# Patient Record
Sex: Female | Born: 1980
Health system: Southern US, Community
[De-identification: ages and names within clinical notes are randomized; demographics above are authoritative.]

## PROBLEM LIST (undated history)

## (undated) DIAGNOSIS — E282 Polycystic ovarian syndrome: Secondary | ICD-10-CM

## (undated) DIAGNOSIS — T7840XA Allergy, unspecified, initial encounter: Secondary | ICD-10-CM

## (undated) DIAGNOSIS — F32A Depression, unspecified: Secondary | ICD-10-CM

## (undated) DIAGNOSIS — F329 Major depressive disorder, single episode, unspecified: Secondary | ICD-10-CM

## (undated) HISTORY — DX: Polycystic ovarian syndrome: E28.2

## (undated) HISTORY — DX: Depression, unspecified: F32.A

## (undated) HISTORY — DX: Allergy, unspecified, initial encounter: T78.40XA

## (undated) HISTORY — PX: ABDOMINAL HYSTERECTOMY: SHX81

## (undated) HISTORY — PX: GASTRIC BYPASS: SHX52

## (undated) HISTORY — PX: TONSILLECTOMY AND ADENOIDECTOMY: SHX28

---
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
## 2000-03-02 HISTORY — PX: REDUCTION MAMMAPLASTY: SUR839

## 2013-02-13 DIAGNOSIS — L509 Urticaria, unspecified: Secondary | ICD-10-CM | POA: Insufficient documentation

## 2018-04-06 DIAGNOSIS — Z713 Dietary counseling and surveillance: Secondary | ICD-10-CM | POA: Diagnosis not present

## 2018-04-12 DIAGNOSIS — Z713 Dietary counseling and surveillance: Secondary | ICD-10-CM | POA: Diagnosis not present

## 2018-05-04 DIAGNOSIS — Z713 Dietary counseling and surveillance: Secondary | ICD-10-CM | POA: Diagnosis not present

## 2018-05-10 DIAGNOSIS — Z713 Dietary counseling and surveillance: Secondary | ICD-10-CM | POA: Diagnosis not present

## 2018-08-22 ENCOUNTER — Emergency Department
Admission: EM | Admit: 2018-08-22 | Discharge: 2018-08-22 | Disposition: A | Discharge status: Home / Self Care | Payer: BC Managed Care – PPO | Attending: Emergency Medicine | Admitting: Emergency Medicine

## 2018-08-22 ENCOUNTER — Emergency Department: Payer: BC Managed Care – PPO

## 2018-08-22 ENCOUNTER — Other Ambulatory Visit: Payer: Self-pay

## 2018-08-22 DIAGNOSIS — Z03818 Encounter for observation for suspected exposure to other biological agents ruled out: Secondary | ICD-10-CM | POA: Diagnosis not present

## 2018-08-22 DIAGNOSIS — L500 Allergic urticaria: Secondary | ICD-10-CM | POA: Diagnosis not present

## 2018-08-22 DIAGNOSIS — Z20828 Contact with and (suspected) exposure to other viral communicable diseases: Secondary | ICD-10-CM | POA: Insufficient documentation

## 2018-08-22 DIAGNOSIS — T7840XA Allergy, unspecified, initial encounter: Secondary | ICD-10-CM | POA: Diagnosis not present

## 2018-08-22 DIAGNOSIS — L509 Urticaria, unspecified: Secondary | ICD-10-CM | POA: Diagnosis not present

## 2018-08-22 LAB — CBC WITH DIFFERENTIAL/PLATELET
Abs Immature Granulocytes: 0.05 10*3/uL (ref 0.00–0.07)
Basophils Absolute: 0 10*3/uL (ref 0.0–0.1)
Basophils Relative: 0 %
Eosinophils Absolute: 0 10*3/uL (ref 0.0–0.5)
Eosinophils Relative: 0 %
HCT: 28.3 % — ABNORMAL LOW (ref 36.0–46.0)
Hemoglobin: 8.7 g/dL — ABNORMAL LOW (ref 12.0–15.0)
Immature Granulocytes: 1 %
Lymphocytes Relative: 8 %
Lymphs Abs: 0.9 10*3/uL (ref 0.7–4.0)
MCH: 21.6 pg — ABNORMAL LOW (ref 26.0–34.0)
MCHC: 30.7 g/dL (ref 30.0–36.0)
MCV: 70.2 fL — ABNORMAL LOW (ref 80.0–100.0)
Monocytes Absolute: 0.2 10*3/uL (ref 0.1–1.0)
Monocytes Relative: 2 %
Neutro Abs: 9.6 10*3/uL — ABNORMAL HIGH (ref 1.7–7.7)
Neutrophils Relative %: 89 %
Platelets: 308 10*3/uL (ref 150–400)
RBC: 4.03 MIL/uL (ref 3.87–5.11)
RDW: 16.2 % — ABNORMAL HIGH (ref 11.5–15.5)
WBC: 10.7 10*3/uL — ABNORMAL HIGH (ref 4.0–10.5)
nRBC: 0 % (ref 0.0–0.2)

## 2018-08-22 LAB — URINALYSIS, COMPLETE (UACMP) WITH MICROSCOPIC
Bacteria, UA: NONE SEEN
Bilirubin Urine: NEGATIVE
Glucose, UA: NEGATIVE mg/dL
Hgb urine dipstick: NEGATIVE
Ketones, ur: NEGATIVE mg/dL
Leukocytes,Ua: NEGATIVE
Nitrite: NEGATIVE
Protein, ur: NEGATIVE mg/dL
Specific Gravity, Urine: 1.013 (ref 1.005–1.030)
pH: 6 (ref 5.0–8.0)

## 2018-08-22 LAB — COMPREHENSIVE METABOLIC PANEL
ALT: 26 U/L (ref 0–44)
AST: 23 U/L (ref 15–41)
Albumin: 3.7 g/dL (ref 3.5–5.0)
Alkaline Phosphatase: 37 U/L — ABNORMAL LOW (ref 38–126)
Anion gap: 8 (ref 5–15)
BUN: 15 mg/dL (ref 6–20)
CO2: 22 mmol/L (ref 22–32)
Calcium: 8.2 mg/dL — ABNORMAL LOW (ref 8.9–10.3)
Chloride: 110 mmol/L (ref 98–111)
Creatinine, Ser: 0.61 mg/dL (ref 0.44–1.00)
GFR calc Af Amer: >60 mL/min (ref >60)
GFR calc non Af Amer: >60 mL/min (ref >60)
Glucose, Bld: 127 mg/dL — ABNORMAL HIGH (ref 70–99)
Potassium: 3.6 mmol/L (ref 3.5–5.1)
Sodium: 140 mmol/L (ref 135–145)
Total Bilirubin: 0.4 mg/dL (ref 0.3–1.2)
Total Protein: 6.5 g/dL (ref 6.5–8.1)

## 2018-08-22 MED ORDER — DIPHENHYDRAMINE HCL 50 MG/ML IJ SOLN
25.0000 mg | Freq: Once | INTRAMUSCULAR | Status: AC
  Administered 2018-08-22: 25 mg via INTRAVENOUS
  Filled 2018-08-22: qty 1

## 2018-08-22 MED ORDER — METHYLPREDNISOLONE SODIUM SUCC 125 MG IJ SOLR
125.0000 mg | Freq: Once | INTRAMUSCULAR | Status: AC
  Administered 2018-08-22: 125 mg via INTRAVENOUS
  Filled 2018-08-22: qty 2

## 2018-08-22 MED ORDER — FAMOTIDINE IN NACL 20-0.9 MG/50ML-% IV SOLN
20.0000 mg | Freq: Once | INTRAVENOUS | Status: AC
  Administered 2018-08-22: 20 mg via INTRAVENOUS
  Filled 2018-08-22: qty 50

## 2018-08-22 MED ORDER — FAMOTIDINE 20 MG PO TABS: 20.0000 mg | ORAL_TABLET | Freq: Two times a day (BID) | ORAL | 0 refills | Status: DC

## 2018-08-22 MED ORDER — PREDNISONE 20 MG PO TABS: ORAL_TABLET | ORAL | 0 refills | Status: DC

## 2018-08-22 MED ORDER — EPINEPHRINE 0.3 MG/0.3ML IJ SOAJ
0.3000 mg | Freq: Once | INTRAMUSCULAR | Status: AC
  Administered 2018-08-22: 0.3 mg via INTRAMUSCULAR
  Filled 2018-08-22: qty 0.3

## 2018-08-22 MED ORDER — SODIUM CHLORIDE 0.9 % IV BOLUS
1000.0000 mL | Freq: Once | INTRAVENOUS | Status: AC
  Administered 2018-08-22: 1000 mL via INTRAVENOUS

## 2018-08-22 MED ORDER — EPINEPHRINE 0.3 MG/0.3ML IJ SOAJ: 0.3000 mg | Freq: Once | INTRAMUSCULAR | 2 refills | Status: AC

## 2018-08-22 NOTE — Discharge Instructions (Signed)
1. Take the following medicines for the next 4 days: Prednisone 60mg daily Pepcid 20mg twice daily 2. Take Benadryl as needed for itching. 3. Use Epi-Pen in case of acute, life-threatening allergic reaction. 4. Return to the ER for worsening symptoms, persistent vomiting, difficulty breathing or other concerns.  

## 2018-08-22 NOTE — ED Notes (Signed)
Pt speaking with this RN in NAD, reports no difficulty breathing. Lip swelling has subsided.

## 2018-08-22 NOTE — ED Triage Notes (Signed)
Pt with hives noted to face, around eyes, pt denies shob, pt is hoarse, complains of throat soreness. No resp distress noted.

## 2018-08-22 NOTE — ED Provider Notes (Signed)
[facility] Emergency Department Provider Note   ____________________________________________   First MD Initiated Contact with Patient 08/22/18 [phone]     (approximate)  I have reviewed the triage vital signs and the nursing notes.   HISTORY  Chief Complaint Allergic Reaction    HPI Sandra Munoz is a 38 y.o. female who presents to the ED from home with a chief complaint of acute allergic reaction with hives.  Patient has had similar symptoms previously.  Was followed by immunology in [place] when she lived there with testing which did not determine etiology of her allergic reactions.  States she has been having progressive hives all week.  She had a telemedicine visit earlier in the week and was prescribed prednisone 50 mg daily.  Her last dose was 2 days ago.  Symptoms worsened overnight and now patient is having a sensation of throat soreness.  Took 50 mg oral Benadryl prior to arrival.  Did not have an EpiPen at home.  Denies new food, medications, environmental exposures.  Denies recent fever, cough, chest pain, shortness of breath, abdominal pain, nausea, vomiting or dizziness.  Denies recent travel, trauma or exposure to persons diagnosed with coronavirus.       Past medical history Allergic reactions   There are no active problems to display for this patient.    Prior to Admission medications   Medication Sig Start Date End Date Taking? Authorizing Provider  EPINEPHrine 0.3 mg/0.3 mL IJ SOAJ injection Inject 0.3 mLs (0.3 mg total) into the muscle once for 1 dose. 08/22/18 08/22/18  Irean HongSung, Kennen Stammer J, MD  famotidine (PEPCID) 20 MG tablet Take 1 tablet (20 mg total) by mouth 2 (two) times daily. 08/22/18   Irean HongSung, Aletheia Tangredi J, MD  predniSONE (DELTASONE) 20 MG tablet 3 tablets daily x 4 days 08/22/18   Irean HongSung, Mohamed Portlock J, MD    Allergies Patient has no known allergies.  No family history on file.  Social History Social History   Tobacco Use  . Smoking  status: Not on file  Substance Use Topics  . Alcohol use: Not on file  . Drug use: Not on file  No recent EtOH  Review of Systems  Constitutional: No fever/chills Eyes: No visual changes. ENT: Sensation of throat soreness.  No sore throat. Cardiovascular: Denies chest pain. Respiratory: Denies shortness of breath. Gastrointestinal: No abdominal pain.  No nausea, no vomiting.  No diarrhea.  No constipation. Genitourinary: Negative for dysuria. Musculoskeletal: Negative for back pain. Skin: Positive for rash. Neurological: Negative for headaches, focal weakness or numbness.   ____________________________________________   PHYSICAL EXAM:  VITAL SIGNS: ED Triage Vitals  Enc Vitals Group     BP 08/22/18 0324 116/64     Pulse Rate 08/22/18 0324 100     Resp 08/22/18 0324 16     Temp 08/22/18 0324 98.8 F (37.1 C)     Temp Source 08/22/18 0324 Oral     SpO2 08/22/18 0324 98 %     Weight 08/22/18 0325 144 lb (65.3 kg)     Height 08/22/18 0325 4\' 11"  (1.499 m)     Head Circumference --      Peak Flow --      Pain Score 08/22/18 0325 0     Pain Loc --      Pain Edu? --      Excl. in GC? --     Constitutional: Alert and oriented.  Uncomfortable appearing and in mild acute distress. Eyes: Conjunctivae are  normal. PERRL. EOMI. Head: Atraumatic. Nose: No congestion/rhinnorhea. Mouth/Throat: Mucous membranes are moist.  Oropharynx non-erythematous.  No tongue or lip swelling.  Minimally hoarse voice.  No drooling. Neck: No stridor.  Soft submental space. Cardiovascular: Normal rate, regular rhythm. Grossly normal heart sounds.  Good peripheral circulation. Respiratory: Normal respiratory effort.  No retractions. Lungs CTAB. Gastrointestinal: Soft and nontender. No distention. No abdominal bruits. No CVA tenderness. Musculoskeletal: No lower extremity tenderness nor edema.  No joint effusions. Neurologic:  Normal speech and language. No gross focal neurologic deficits are  appreciated. No gait instability. Skin:  Skin is warm, dry and intact.  Diffuse urticaria noted. Psychiatric: Mood and affect are normal. Speech and behavior are normal.  ____________________________________________   LABS (all labs ordered are listed, but only abnormal results are displayed)  Labs Reviewed  NOVEL CORONAVIRUS, NAA (HOSPITAL ORDER, SEND-OUT TO REF LAB)  CBC WITH DIFFERENTIAL/PLATELET  COMPREHENSIVE METABOLIC PANEL  URINALYSIS, COMPLETE (UACMP) WITH MICROSCOPIC   ____________________________________________  EKG  None ____________________________________________  RADIOLOGY  ED MD interpretation: None  Official radiology report(s): No results found.  ____________________________________________   PROCEDURES  Procedure(s) performed (including Critical Care):  Procedures   ____________________________________________   INITIAL IMPRESSION / ASSESSMENT AND PLAN / ED COURSE  As part of my medical decision making, I reviewed the following data within the electronic MEDICAL RECORD NUMBER Nursing notes reviewed and incorporated, Old chart reviewed and Notes from prior ED visits     Sandra DieterMary Haugen was evaluated in Emergency Department on 08/22/2018 for the symptoms described in the history of present illness. She was evaluated in the context of the global COVID-19 pandemic, which necessitated consideration that the patient might be at risk for infection with the SARS-CoV-2 virus that causes COVID-19. Institutional protocols and algorithms that pertain to the evaluation of patients at risk for COVID-19 are in a state of rapid change based on information released by regulatory bodies including the CDC and federal and state organizations. These policies and algorithms were followed during the patient's care in the ED.   38 year old female with prior history of allergic reactions presenting with diffuse urticaria and sensation of throat soreness; no angioedema.  Administer  IV allergic reaction cocktail to include Solu-Medrol, Pepcid, fluids.  Administer EpiPen.  Patient took Benadryl prior to arrival.  Will observe in the ED for a minimum of 3 to 4 hours.  Clinical Course as of Aug 22 654  Mon Aug 22, 2018  0623 Patient's hives are almost resolved.  Her throat sensation is feeling significantly better.  However, patient is concerned because she has hives on the palms of her hands which she states she has never had previously.  She is concerned her allergic reaction is stemming from an "internal infection".  We discussed her concern and will check basic labs including send out COVID swab.   [JS]  [id number] Laboratory and imaging pending.  Care is signed out to Dr. Cyril LoosenKinner at change of shift.  Anticipate discharge home on EpiPen, Prednisone and Pepcid.   [JS]    Clinical Course User Index [JS] Irean HongSung, Lakeem Rozo J, MD     ____________________________________________   FINAL CLINICAL IMPRESSION(S) / ED DIAGNOSES  Final diagnoses:  Allergic reaction, initial encounter  Urticaria     ED Discharge Orders         Ordered    EPINEPHrine 0.3 mg/0.3 mL IJ SOAJ injection   Once     08/22/18 0342    predniSONE (DELTASONE) 20 MG tablet  08/22/18 0342    famotidine (PEPCID) 20 MG tablet  2 times daily     08/22/18 8206           Note:  This document was prepared using Dragon voice recognition software and may include unintentional dictation errors.   Paulette Blanch, MD 08/22/18 [phone]

## 2018-08-23 LAB — NOVEL CORONAVIRUS, NAA (HOSP ORDER, SEND-OUT TO REF LAB; TAT 18-24 HRS): SARS-CoV-2, NAA: NOT DETECTED

## 2018-08-26 DIAGNOSIS — L500 Allergic urticaria: Secondary | ICD-10-CM | POA: Diagnosis not present

## 2018-08-26 DIAGNOSIS — J301 Allergic rhinitis due to pollen: Secondary | ICD-10-CM | POA: Diagnosis not present

## 2018-08-30 ENCOUNTER — Ambulatory Visit (INDEPENDENT_AMBULATORY_CARE_PROVIDER_SITE_OTHER): Payer: BC Managed Care – PPO | Admitting: Family Medicine

## 2018-08-30 ENCOUNTER — Other Ambulatory Visit: Payer: Self-pay

## 2018-08-30 ENCOUNTER — Encounter: Payer: Self-pay | Admitting: Family Medicine

## 2018-08-30 VITALS — BP 112/70 | HR 90 | Temp 98.0°F | Resp 16 | Ht 59.0 in | Wt 150.0 lb

## 2018-08-30 DIAGNOSIS — F419 Anxiety disorder, unspecified: Secondary | ICD-10-CM | POA: Insufficient documentation

## 2018-08-30 DIAGNOSIS — Z8659 Personal history of other mental and behavioral disorders: Secondary | ICD-10-CM | POA: Insufficient documentation

## 2018-08-30 DIAGNOSIS — Z8619 Personal history of other infectious and parasitic diseases: Secondary | ICD-10-CM

## 2018-08-30 DIAGNOSIS — L509 Urticaria, unspecified: Secondary | ICD-10-CM

## 2018-08-30 DIAGNOSIS — F329 Major depressive disorder, single episode, unspecified: Secondary | ICD-10-CM

## 2018-08-30 DIAGNOSIS — F32A Depression, unspecified: Secondary | ICD-10-CM

## 2018-08-30 MED ORDER — LEVOCETIRIZINE DIHYDROCHLORIDE 5 MG PO TABS: 5.0000 mg | ORAL_TABLET | Freq: Every morning | ORAL | 1 refills | Status: DC

## 2018-08-30 MED ORDER — FAMOTIDINE 20 MG PO TABS: 20.0000 mg | ORAL_TABLET | Freq: Two times a day (BID) | ORAL | 0 refills | Status: DC

## 2018-08-30 NOTE — Progress Notes (Signed)
Name: Sandra Munoz   MRN: [id number]    DOB: Feb 19, 1981   Date:08/30/2018       Progress Note  Subjective  Chief Complaint  Chief Complaint  Patient presents with  . Establish Care  . Urticaria    seen in ER , all over face swollen. Hives comes and goes for 1 month    HPI  PT presents to establish care and for urticaria.  She was seen in the ER 08/22/2018, was given prednisone, pepcid, and benadryl; also given epipen PRN.  She completed this course and did see improvement of the hives yesterday, but they returned again today.  She saw an allergist on Friday 08/26/2018, had blood work done, and was told to wait things out with no additional treatment at this time. She does have hydroxyzine, and has been taking 50mg  once daily at night which has been helping only a little bit. Claritin OTC has not been working during the day.  She denies chest tightness, shortness of breath, oral swelling.  She has been logging her foods and trying to ID triggers.  [place] [facility]. She is married and has a 226 year old daughter and a stepson who is almost 7018.  S/p Hysterectomy 2019 - took cervix, left ovaries intact.  She has hx PCOS.   Anxiety and Depression and OCD: Diagnosed with OCD about 7-8 years ago.  Mom had Bipolar 1.  She manages her symptoms with CBT, taking 150mg  Zoloft daily which helps her significantly.  She has not found counseling here yet, but has been working on some workbooks from her prior counselor. No SI/HI.   Patient Active Problem List   Diagnosis Date Noted  . History of OCD (obsessive compulsive disorder) 08/30/2018  . Anxiety and depression 08/30/2018  . Urticaria of unknown origin 02/13/2013    Past Surgical History:  Procedure Laterality Date  . ABDOMINAL HYSTERECTOMY    . GASTRIC BYPASS      Family History  Problem Relation Age of Onset  . Cancer Mother   . Autoimmune disease Mother     Social History   Socioeconomic History  .  Marital status: Married    Spouse name: Cristal DeerChristopher  . Number of children: 1  . Years of education: Not on file  . Highest education level: Not on file  Occupational History  . Not on file  Social Needs  . Financial resource strain: Not hard at all  . Food insecurity    Worry: Never true    Inability: Never true  . Transportation needs    Medical: No    Non-medical: No  Tobacco Use  . Smoking status: Former Smoker    Types: Cigarettes    Quit date: 09/30/2009    Years since quitting: 8.9  . Smokeless tobacco: Never Used  Substance and Sexual Activity  . Alcohol use: Never    Frequency: Never  . Drug use: Never  . Sexual activity: Yes    Birth control/protection: Surgical  Lifestyle  . Physical activity    Days per week: 3 days    Minutes per session: 60 min  . Stress: Only a little  Relationships  . Social connections    Talks on phone: More than three times a week    Gets together: More than three times a week    Attends religious service: 1 to 4 times per year    Active member of club or organization: Not on file  Attends meetings of clubs or organizations: Never    Relationship status: Married  . Intimate partner violence    Fear of current or ex partner: No    Emotionally abused: No    Physically abused: No    Forced sexual activity: No  Other Topics Concern  . Not on file  Social History Narrative  . Not on file     Current Outpatient Medications:  .  hydrOXYzine (VISTARIL) 50 MG capsule, TAKE 1 TO 2 CAPSULES BY MOUTH THREE TIMES DAILY AS NEEDED FOR ANXIETY INSOMNIA., Disp: , Rfl:  .  Probiotic Product (PROBIOTIC-10) CHEW, , Disp: , Rfl:  .  sertraline (ZOLOFT) 100 MG tablet, 150 mg. , Disp: , Rfl:  .  valACYclovir (VALTREX) 500 MG tablet, , Disp: , Rfl:   No Known Allergies  I personally reviewed active problem list, medication list, allergies, notes from last encounter, lab results with the patient/caregiver today.   ROS Constitutional: Negative  for fever or weight change.  Respiratory: Negative for cough and shortness of breath.   Cardiovascular: Negative for chest pain or palpitations.  Gastrointestinal: Negative for abdominal pain, no bowel changes.  Musculoskeletal: Negative for gait problem or joint swelling.  Skin: Positive for rash.  Neurological: Negative for dizziness or headache.  No other specific complaints in a complete review of systems (except as listed in HPI above).  Objective  Vitals:   08/30/18 0752  BP: 112/70  Pulse: 90  Resp: 16  Temp: 98 F (36.7 C)  TempSrc: Oral  SpO2: 99%  Weight: 150 lb (68 kg)  Height: 4\' 11"  (1.499 m)   Body mass index is 30.3 kg/m.  Physical Exam  Constitutional: Patient appears well-developed and well-nourished. No distress.  HENT: Head: Normocephalic and atraumatic. Ears: bilateral TMs with no erythema or effusion; Nose: Nose normal. Mouth/Throat: Oropharynx is clear and moist. No oropharyngeal exudate or tonsillar swelling.  Eyes: Conjunctivae and EOM are normal. No scleral icterus.  Pupils are equal, round, and reactive to light.  Neck: Normal range of motion. Neck supple. No JVD present. No thyromegaly present.  Cardiovascular: Normal rate, regular rhythm and normal heart sounds.  No murmur heard. No BLE edema. Pulmonary/Chest: Effort normal and breath sounds normal. No respiratory distress. Abdominal: Soft. Bowel sounds are normal, no distension. There is no tenderness. No masses. Musculoskeletal: Normal range of motion, no joint effusions. No gross deformities Neurological: Pt is alert and oriented to person, place, and time. No cranial nerve deficit. Coordination, balance, strength, speech and gait are normal.  Skin: There is significant urticaric rash to BUE, BLE, on palms but not the soles; abdomen is covered in confluent urticaric rash, on face, neck, scattered lesions on the back, she also reports rash on the genitals and buttocks. Psychiatric: Patient has a  normal mood and affect. behavior is normal. Judgment and thought content normal.  No results found for this or any previous visit (from the past 72 hour(s)).  PHQ2/9: Depression screen PHQ 2/9 08/30/2018  Decreased Interest 0  Down, Depressed, Hopeless 0  PHQ - 2 Score 0  Altered sleeping 0  Tired, decreased energy 0  Change in appetite 0  Feeling bad or failure about yourself  0  Trouble concentrating 0  Moving slowly or fidgety/restless 0  Suicidal thoughts 0  PHQ-9 Score 0  Difficult doing work/chores Not difficult at all   PHQ-2/9 Result is negative.    Fall Risk: Fall Risk  08/30/2018  Falls in the past year? 0  Number falls in past yr: 0  Injury with Fall? 0  Follow up Falls evaluation completed    Assessment & Plan  1. Urticaria - famotidine (PEPCID) 20 MG tablet; Take 1 tablet (20 mg total) by mouth 2 (two) times daily.  Dispense: 60 tablet; Refill: 0 - levocetirizine (XYZAL) 5 MG tablet; Take 1 tablet (5 mg total) by mouth every morning.  Dispense: 30 tablet; Refill: 1  2. Hx of cold sores - Taking valtrex PRN  3. Anxiety and depression - Continue Zoloft  4. History of OCD (obsessive compulsive disorder) - Continue Zoloft, encouraged cournseling established here.

## 2018-08-30 NOTE — Patient Instructions (Addendum)
Taky 5mg  Xyzal and 25mg  Benadryl each morning, and take 50mg  Hydroxyzine at night (DO NOT take benadryl at night).  Take Pepcid twice daily.   [facility] for CBT.  Here are some resources to help you if you feel you are in a mental health crisis:  [place] - Call [phone]  for help - Website with more resources: GripTrip.com.pt  [facility] - Call [phone] for help. - Mobile Crisis Program available 24 hours a day, 365 days a year. - Available for anyone of any age in [place] counties.  [facility] - Address: [address], [place]  - Telephone: [phone]  - Hours of Operation: Sunday - Saturday - 8:00 a.m. - 8:00 p.m. - Medicaid, Medicare (Government Issued Only), BCBS, and [employer], [place], Psychiatrists on-site to provide medication management, [place], and Peer Support Care.  [facility] - Call [phone] for help. - Mobile Crisis Program available 24 hours a day, 365 days a year. - Available for anyone of any age in [place]

## 2018-09-08 ENCOUNTER — Ambulatory Visit: Payer: Self-pay | Admitting: Family Medicine

## 2018-09-09 ENCOUNTER — Ambulatory Visit (INDEPENDENT_AMBULATORY_CARE_PROVIDER_SITE_OTHER): Payer: BLUE CROSS/BLUE SHIELD | Admitting: Family Medicine

## 2018-09-09 ENCOUNTER — Encounter: Payer: Self-pay | Admitting: Family Medicine

## 2018-09-09 ENCOUNTER — Other Ambulatory Visit: Payer: Self-pay

## 2018-09-09 VITALS — BP 120/70 | HR 87 | Temp 97.3°F | Resp 14 | Ht 59.0 in | Wt 148.8 lb

## 2018-09-09 DIAGNOSIS — E669 Obesity, unspecified: Secondary | ICD-10-CM | POA: Diagnosis not present

## 2018-09-09 DIAGNOSIS — Z1159 Encounter for screening for other viral diseases: Secondary | ICD-10-CM

## 2018-09-09 DIAGNOSIS — Z1283 Encounter for screening for malignant neoplasm of skin: Secondary | ICD-10-CM

## 2018-09-09 DIAGNOSIS — E538 Deficiency of other specified B group vitamins: Secondary | ICD-10-CM | POA: Diagnosis not present

## 2018-09-09 DIAGNOSIS — Z0000 Encounter for general adult medical examination without abnormal findings: Secondary | ICD-10-CM

## 2018-09-09 DIAGNOSIS — Z9884 Bariatric surgery status: Secondary | ICD-10-CM | POA: Diagnosis not present

## 2018-09-09 NOTE — Patient Instructions (Signed)
Preventive Care 21-39 Years Old, Female Preventive care refers to visits with your health care provider and lifestyle choices that can promote health and wellness. This includes:  A yearly physical exam. This may also be called an annual well check.  Regular dental visits and eye exams.  Immunizations.  Screening for certain conditions.  Healthy lifestyle choices, such as eating a healthy diet, getting regular exercise, not using drugs or products that contain nicotine and tobacco, and limiting alcohol use. What can I expect for my preventive care visit? Physical exam Your health care provider will check your:  Height and weight. This may be used to calculate body mass index (BMI), which tells if you are at a healthy weight.  Heart rate and blood pressure.  Skin for abnormal spots. Counseling Your health care provider may ask you questions about your:  Alcohol, tobacco, and drug use.  Emotional well-being.  Home and relationship well-being.  Sexual activity.  Eating habits.  Work and work environment.  Method of birth control.  Menstrual cycle.  Pregnancy history. What immunizations do I need?  Influenza (flu) vaccine  This is recommended every year. Tetanus, diphtheria, and pertussis (Tdap) vaccine  You may need a Td booster every 10 years. Varicella (chickenpox) vaccine  You may need this if you have not been vaccinated. Human papillomavirus (HPV) vaccine  If recommended by your health care provider, you may need three doses over 6 months. Measles, mumps, and rubella (MMR) vaccine  You may need at least one dose of MMR. You may also need a second dose. Meningococcal conjugate (MenACWY) vaccine  One dose is recommended if you are age 19-21 years and a first-year college student living in a residence hall, or if you have one of several medical conditions. You may also need additional booster doses. Pneumococcal conjugate (PCV13) vaccine  You may need  this if you have certain conditions and were not previously vaccinated. Pneumococcal polysaccharide (PPSV23) vaccine  You may need one or two doses if you smoke cigarettes or if you have certain conditions. Hepatitis A vaccine  You may need this if you have certain conditions or if you travel or work in places where you may be exposed to hepatitis A. Hepatitis B vaccine  You may need this if you have certain conditions or if you travel or work in places where you may be exposed to hepatitis B. Haemophilus influenzae type b (Hib) vaccine  You may need this if you have certain conditions. You may receive vaccines as individual doses or as more than one vaccine together in one shot (combination vaccines). Talk with your health care provider about the risks and benefits of combination vaccines. What tests do I need?  Blood tests  Lipid and cholesterol levels. These may be checked every 5 years starting at age 20.  Hepatitis C test.  Hepatitis B test. Screening  Diabetes screening. This is done by checking your blood sugar (glucose) after you have not eaten for a while (fasting).  Sexually transmitted disease (STD) testing.  BRCA-related cancer screening. This may be done if you have a family history of breast, ovarian, tubal, or peritoneal cancers.  Pelvic exam and Pap test. This may be done every 3 years starting at age 21. Starting at age 30, this may be done every 5 years if you have a Pap test in combination with an HPV test. Talk with your health care provider about your test results, treatment options, and if necessary, the need for more tests.   Follow these instructions at home: Eating and drinking   Eat a diet that includes fresh fruits and vegetables, whole grains, lean protein, and low-fat dairy.  Take vitamin and mineral supplements as recommended by your health care provider.  Do not drink alcohol if: ? Your health care provider tells you not to drink. ? You are  pregnant, may be pregnant, or are planning to become pregnant.  If you drink alcohol: ? Limit how much you have to 0-1 drink a day. ? Be aware of how much alcohol is in your drink. In the [place] one drink equals one 12 oz bottle of beer (355 mL), one 5 oz glass of wine (148 mL), or one 1 oz glass of hard liquor (44 mL). Lifestyle  Take daily care of your teeth and gums.  Stay active. Exercise for at least 30 minutes on 5 or more days each week.  Do not use any products that contain nicotine or tobacco, such as cigarettes, e-cigarettes, and chewing tobacco. If you need help quitting, ask your health care provider.  If you are sexually active, practice safe sex. Use a condom or other form of birth control (contraception) in order to prevent pregnancy and STIs (sexually transmitted infections). If you plan to become pregnant, see your health care provider for a preconception visit. What's next?  Visit your health care provider once a year for a well check visit.  Ask your health care provider how often you should have your eyes and teeth checked.  Stay up to date on all vaccines. This information is not intended to replace advice given to you by your health care provider. Make sure you discuss any questions you have with your health care provider. Document Released: 04/14/2001 Document Revised: 10/28/2017 Document Reviewed: 10/28/2017 Elsevier Patient Education  [place].   Kegel Exercises  Kegel exercises can help strengthen your pelvic floor muscles. The pelvic floor is a group of muscles that support your rectum, small intestine, and bladder. In females, pelvic floor muscles also help support the womb (uterus). These muscles help you control the flow of urine and stool. Kegel exercises are painless and simple, and they do not require any equipment. Your provider may suggest Kegel exercises to:  Improve bladder and bowel control.  Improve sexual response.  Improve weak  pelvic floor muscles after surgery to remove the uterus (hysterectomy) or pregnancy (females).  Improve weak pelvic floor muscles after prostate gland removal or surgery (males). Kegel exercises involve squeezing your pelvic floor muscles, which are the same muscles you squeeze when you try to stop the flow of urine or keep from passing gas. The exercises can be done while sitting, standing, or lying down, but it is best to vary your position. Exercises How to do Kegel exercises: 1. Squeeze your pelvic floor muscles tight. You should feel a tight lift in your rectal area. If you are a female, you should also feel a tightness in your vaginal area. Keep your stomach, buttocks, and legs relaxed. 2. Hold the muscles tight for up to 10 seconds. 3. Breathe normally. 4. Relax your muscles. 5. Repeat as told by your health care provider. Repeat this exercise daily as told by your health care provider. Continue to do this exercise for at least 4-6 weeks, or for as long as told by your health care provider. You may be referred to a physical therapist who can help you learn more about how to do Kegel exercises. Depending on your condition, your health  care provider may recommend:  Varying how long you squeeze your muscles.  Doing several sets of exercises every day.  Doing exercises for several weeks.  Making Kegel exercises a part of your regular exercise routine. This information is not intended to replace advice given to you by your health care provider. Make sure you discuss any questions you have with your health care provider. Document Released: 02/03/2012 Document Revised: 10/06/2017 Document Reviewed: 10/06/2017 Elsevier Patient Education  2020 [employer].

## 2018-09-09 NOTE — Progress Notes (Signed)
Name: Sandra Munoz   MRN: [id number]    DOB: 1980/10/11   Date:09/09/2018       Progress Note  Subjective  Chief Complaint  Chief Complaint  Patient presents with  . Annual Exam    HPI  Patient presents for annual CPE.  Diet: She recently found out that she is allergic to corn and dairy - urticaria is much better now. Diet is balanced with plenty of vegetables.  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  USPSTF grade A and B recommendations    Office Visit from 09/09/2018 in [facility]  AUDIT-C Score  1    Occ use.  Depression: Phq 9 is negative Depression screen [facility] 2/9 09/09/2018 08/30/2018  Decreased Interest 0 0  Down, Depressed, Hopeless 0 0  PHQ - 2 Score 0 0  Altered sleeping 0 0  Tired, decreased energy 0 0  Change in appetite 0 0  Feeling bad or failure about yourself  0 0  Trouble concentrating 0 0  Moving slowly or fidgety/restless 0 0  Suicidal thoughts 0 0  PHQ-9 Score 0 0  Difficult doing work/chores Not difficult at all Not difficult at all   Hypertension: BP Readings from Last 3 Encounters:  09/09/18 120/70  08/30/18 112/70  08/22/18 (!) 108/58   Obesity: Wt Readings from Last 3 Encounters:  09/09/18 148 lb 12.8 oz (67.5 kg)  08/30/18 150 lb (68 kg)  08/22/18 144 lb (65.3 kg)   BMI Readings from Last 3 Encounters:  09/09/18 30.05 kg/m  08/30/18 30.30 kg/m  08/22/18 29.08 kg/m    Hep C Screening: Will check today STD testing and prevention (HIV/chl/gon/syphilis): Will check HIV today; declines additional testing. No new sexual partners in the last year Intimate partner violence: No concerns Sexual History/Pain during Intercourse:  No concerns Menstrual History/LMP/Abnormal Bleeding: S/p hysterectomy (large fibroid) she does not have cervix and does not do paps any longer Incontinence Symptoms: No concerns - has gotten much better since fibroid removal, but does  have occasional symptoms.   Advanced Care Planning: A voluntary discussion about advance care planning including the explanation and discussion of advance directives.  Discussed health care proxy and Living will, and the patient was able to identify a health care proxy as Lindley Magnus.  Patient does not have a living will at present time. If patient does have living will, I have requested they bring this to the clinic to be scanned in to their chart.  Breast cancer:  No results found for: HMMAMMO  BRCA gene screening: Family history - MGM, Maternal Aunt - none had genetic testing.  Had mammogram in her 83's and it was normal, will start at age 69.  Cervical cancer screening: No cervix present, not having pap smears done any longer.  Osteoporosis Screening: No family history; diet is adequate vitamin D No results found for: HMDEXASCAN  Lipids: We will check today. No results found for: CHOL No results found for: HDL No results found for: LDLCALC No results found for: TRIG No results found for: CHOLHDL No results found for: LDLDIRECT  Glucose:  Glucose, Bld  Date Value Ref Range Status  08/22/2018 127 (H) 70 - 99 mg/dL Final   Skin cancer: She has several lesions she would like looked at; she used to see dermatology, but has not seen one since moving to [place].  We will refer today. Colorectal cancer: Denies family or personal history  of colorectal cancer, no changes in BM's - no blood in stool, dark and tarry stool, mucus in stool, or constipation/diarrhea.  Lung cancer:  Former smoker - Quit September 30, 2009; smoked for 9 years.  Low Dose CT Chest recommended if Age 20-80 years, 30 pack-year currently smoking OR have quit w/in 15years. Patient does not qualify.   ECG: No chest pain, shortness of breath, or palpitations.   Patient Active Problem List   Diagnosis Date Noted  . History of OCD (obsessive compulsive disorder) 08/30/2018  . Anxiety and depression 08/30/2018  . Hx of cold  sores 08/30/2018  . Urticaria of unknown origin 02/13/2013    Past Surgical History:  Procedure Laterality Date  . ABDOMINAL HYSTERECTOMY    . GASTRIC BYPASS      Family History  Problem Relation Age of Onset  . Cancer Mother   . Autoimmune disease Mother     Social History   Socioeconomic History  . Marital status: Married    Spouse name: Harrell Gave  . Number of children: 1  . Years of education: Not on file  . Highest education level: Not on file  Occupational History  . Not on file  Social Needs  . Financial resource strain: Not hard at all  . Food insecurity    Worry: Never true    Inability: Never true  . Transportation needs    Medical: No    Non-medical: No  Tobacco Use  . Smoking status: Former Smoker    Types: Cigarettes    Quit date: 09/30/2009    Years since quitting: 8.9  . Smokeless tobacco: Never Used  Substance and Sexual Activity  . Alcohol use: Yes    Frequency: Never    Comment: ocasional  . Drug use: Never  . Sexual activity: Yes    Birth control/protection: Surgical  Lifestyle  . Physical activity    Days per week: 3 days    Minutes per session: 60 min  . Stress: Only a little  Relationships  . Social connections    Talks on phone: More than three times a week    Gets together: More than three times a week    Attends religious service: 1 to 4 times per year    Active member of club or organization: Not on file    Attends meetings of clubs or organizations: Never    Relationship status: Married  . Intimate partner violence    Fear of current or ex partner: No    Emotionally abused: No    Physically abused: No    Forced sexual activity: No  Other Topics Concern  . Not on file  Social History Narrative  . Not on file     Current Outpatient Medications:  .  famotidine (PEPCID) 20 MG tablet, Take 1 tablet (20 mg total) by mouth 2 (two) times daily., Disp: 60 tablet, Rfl: 0 .  hydrOXYzine (VISTARIL) 50 MG capsule, TAKE 1 TO 2  CAPSULES BY MOUTH THREE TIMES DAILY AS NEEDED FOR ANXIETY INSOMNIA., Disp: , Rfl:  .  levocetirizine (XYZAL) 5 MG tablet, Take 1 tablet (5 mg total) by mouth every morning., Disp: 30 tablet, Rfl: 1 .  Probiotic Product (PROBIOTIC-10) CHEW, , Disp: , Rfl:  .  sertraline (ZOLOFT) 100 MG tablet, 150 mg. , Disp: , Rfl:  .  valACYclovir (VALTREX) 500 MG tablet, , Disp: , Rfl:   No Known Allergies   ROS  Constitutional: Negative for fever or weight change.  Respiratory:  Negative for cough and shortness of breath.   Cardiovascular: Negative for chest pain or palpitations.  Gastrointestinal: Negative for abdominal pain, no bowel changes.  Musculoskeletal: Negative for gait problem or joint swelling.  Skin: Negative for rash.  Neurological: Negative for dizziness or headache.  No other specific complaints in a complete review of systems (except as listed in HPI above).  Objective  Vitals:   09/09/18 0944  BP: 120/70  Pulse: 87  Resp: 14  Temp: (!) 97.3 F (36.3 C)  TempSrc: Oral  SpO2: 99%  Weight: 148 lb 12.8 oz (67.5 kg)  Height: '4\' 11"'  (1.499 m)    Body mass index is 30.05 kg/m.  Physical Exam  Constitutional: Patient appears well-developed and well-nourished. No distress.  HENT: Head: Normocephalic and atraumatic. Ears: B TMs ok, no erythema or effusion; Nose: Nose normal. Mouth/Throat: Oropharynx is clear and moist. No oropharyngeal exudate.  Eyes: Conjunctivae and EOM are normal. Pupils are equal, round, and reactive to light. No scleral icterus.  Neck: Normal range of motion. Neck supple. No JVD present. No thyromegaly present.  Cardiovascular: Normal rate, regular rhythm and normal heart sounds.  No murmur heard. No BLE edema. Pulmonary/Chest: Effort normal and breath sounds normal. No respiratory distress. Abdominal: Soft. Bowel sounds are normal, no distension. There is no tenderness. no masses Breast: no lumps or masses, no nipple discharge or rashes FEMALE  GENITALIA: Deferred Musculoskeletal: Normal range of motion, no joint effusions. No gross deformities Neurological: he is alert and oriented to person, place, and time. No cranial nerve deficit. Coordination, balance, strength, speech and gait are normal.  Skin: Skin is warm and dry. No rash noted. No erythema. There are many nevi on the skin, several skin tags in the bilateral axilla.  There is a single nevus under the LEFT breast that is approx 52m, round, dark brown, and slightly raised. Psychiatric: Patient has a normal mood and affect. behavior is normal. Judgment and thought content normal.  Recent Results (from the past 2160 hour(s))  CBC with Differential     Status: Abnormal   Collection Time: 08/22/18  6:55 AM  Result Value Ref Range   WBC 10.7 (H) 4.0 - 10.5 K/uL   RBC 4.03 3.87 - 5.11 MIL/uL   Hemoglobin 8.7 (L) 12.0 - 15.0 g/dL    Comment: Reticulocyte Hemoglobin testing may be clinically indicated, consider ordering this additional test [id number]   HCT 28.3 (L) 36.0 - 46.0 %   MCV 70.2 (L) 80.0 - 100.0 fL   MCH 21.6 (L) 26.0 - 34.0 pg   MCHC 30.7 30.0 - 36.0 g/dL   RDW 16.2 (H) 11.5 - 15.5 %   Platelets 308 150 - 400 K/uL   nRBC 0.0 0.0 - 0.2 %   Neutrophils Relative % 89 %   Neutro Abs 9.6 (H) 1.7 - 7.7 K/uL   Lymphocytes Relative 8 %   Lymphs Abs 0.9 0.7 - 4.0 K/uL   Monocytes Relative 2 %   Monocytes Absolute 0.2 0.1 - 1.0 K/uL   Eosinophils Relative 0 %   Eosinophils Absolute 0.0 0.0 - 0.5 K/uL   Basophils Relative 0 %   Basophils Absolute 0.0 0.0 - 0.1 K/uL   Immature Granulocytes 1 %   Abs Immature Granulocytes 0.05 0.00 - 0.07 K/uL    Comment: Performed at [facility] [address] [place] [address] Comprehensive metabolic panel     Status: Abnormal   Collection Time: 08/22/18  6:55 AM  Result Value Ref Range   Sodium 140 135 - 145 mmol/L   Potassium 3.6 3.5 - 5.1 mmol/L   Chloride 110 98 - 111 mmol/L   CO2 22 22 - 32 mmol/L    Glucose, Bld 127 (H) 70 - 99 mg/dL   BUN 15 6 - 20 mg/dL   Creatinine, Ser 0.61 0.44 - 1.00 mg/dL   Calcium 8.2 (L) 8.9 - 10.3 mg/dL   Total Protein 6.5 6.5 - 8.1 g/dL   Albumin 3.7 3.5 - 5.0 g/dL   AST 23 15 - 41 U/L   ALT 26 0 - 44 U/L   Alkaline Phosphatase 37 (L) 38 - 126 U/L   Total Bilirubin 0.4 0.3 - 1.2 mg/dL   GFR calc non Af Amer >60 >60 mL/min   GFR calc Af Amer >60 >60 mL/min   Anion gap 8 5 - 15    Comment: Performed at [facility], [address] [place], [place] [address]  Urinalysis, Complete w Microscopic     Status: Abnormal   Collection Time: 08/22/18  6:55 AM  Result Value Ref Range   Color, Urine YELLOW (A) YELLOW   APPearance CLEAR (A) CLEAR   Specific Gravity, Urine 1.013 1.005 - 1.030   pH 6.0 5.0 - 8.0   Glucose, UA NEGATIVE NEGATIVE mg/dL   Hgb urine dipstick NEGATIVE NEGATIVE   Bilirubin Urine NEGATIVE NEGATIVE   Ketones, ur NEGATIVE NEGATIVE mg/dL   Protein, ur NEGATIVE NEGATIVE mg/dL   Nitrite NEGATIVE NEGATIVE   Leukocytes,Ua NEGATIVE NEGATIVE   WBC, UA 0-5 0 - 5 WBC/hpf   Bacteria, UA NONE SEEN NONE SEEN   Squamous Epithelial / LPF 0-5 0 - 5   Mucus PRESENT     Comment: Performed at [facility], [address] [place], [place] [address]  Novel Coronavirus,NAA,(SEND-OUT TO REF LAB - TAT 24-48 hrs); Hosp Order     Status: None   Collection Time: 08/22/18  6:55 AM   Specimen: Nasopharyngeal Swab; Respiratory  Result Value Ref Range   SARS-CoV-2, NAA NOT DETECTED NOT DETECTED    Comment: (NOTE) This test was developed and its performance characteristics determined by [employer]. This test has not been FDA cleared or approved. This test has been authorized by FDA under an Emergency Use Authorization (EUA). This test is only authorized for the duration of time the declaration that circumstances exist justifying the authorization of the emergency use of in vitro diagnostic tests for detection of SARS-CoV-2 virus  and/or diagnosis of COVID-19 infection under section 564(b)(1) of the Act, 21 U.S.C. 175ZWC-5(E)(5), unless the authorization is terminated or revoked sooner. When diagnostic testing is negative, the possibility of a false negative result should be considered in the context of a patient's recent exposures and the presence of clinical signs and symptoms consistent with COVID-19. An individual without symptoms of COVID-19 and who is not shedding SARS-CoV-2 virus would expect to have a negative (not detected) result in this assay. Performed  At: [facility] [place], [place] [id number] Rush Farmer MD TI:[id number]    Coronavirus Source NASOPHARYNGEAL     Comment: Performed at [facility], [address] [place], [place] [address]    PHQ2/9: Depression screen [facility] 2/9 09/09/2018 08/30/2018  Decreased Interest 0 0  Down, Depressed, Hopeless 0 0  PHQ - 2 Score 0 0  Altered sleeping 0 0  Tired, decreased energy 0 0  Change in appetite 0 0  Feeling bad or failure  about yourself  0 0  Trouble concentrating 0 0  Moving slowly or fidgety/restless 0 0  Suicidal thoughts 0 0  PHQ-9 Score 0 0  Difficult doing work/chores Not difficult at all Not difficult at all   Fall Risk: Fall Risk  09/09/2018 08/30/2018  Falls in the past year? 0 0  Number falls in past yr: 0 0  Injury with Fall? 0 0  Follow up Falls evaluation completed Falls evaluation completed    Assessment & Plan  1. Well woman exam (no gynecological exam) -USPSTF grade A and B recommendations reviewed with patient; age-appropriate recommendations, preventive care, screening tests, etc discussed and encouraged; healthy living encouraged; see AVS for patient education given to patient -Discussed importance of 150 minutes of physical activity weekly, eat two servings of fish weekly, eat one serving of tree nuts ( cashews, pistachios, pecans, almonds.Marland Kitchen) every other day, eat 6 servings of  fruit/vegetables daily and drink plenty of water and avoid sweet beverages.   2. S/P gastric bypass - CBC with Differential/Platelet - B12 and Folate Panel - VITAMIN D 25 Hydroxy (Vit-D Deficiency, Fractures) - TSH - Lipid panel - COMPLETE METABOLIC PANEL WITH GFR - Iron, TIBC and Ferritin Panel - Hepatitis C antibody - HIV Antibody (routine testing w rflx)  3. Obesity (BMI 30-39.9) - CBC with Differential/Platelet - TSH - Lipid panel - COMPLETE METABOLIC PANEL WITH GFR  4. Skin cancer screening - Ambulatory referral to Dermatology

## 2018-09-11 ENCOUNTER — Other Ambulatory Visit: Payer: Self-pay | Admitting: Family Medicine

## 2018-09-11 DIAGNOSIS — Z9884 Bariatric surgery status: Secondary | ICD-10-CM

## 2018-09-11 DIAGNOSIS — E538 Deficiency of other specified B group vitamins: Secondary | ICD-10-CM

## 2018-09-11 DIAGNOSIS — E559 Vitamin D deficiency, unspecified: Secondary | ICD-10-CM

## 2018-09-11 DIAGNOSIS — D509 Iron deficiency anemia, unspecified: Secondary | ICD-10-CM

## 2018-09-13 LAB — COMPLETE METABOLIC PANEL WITH GFR
AG Ratio: 1.8 (calc) (ref 1.0–2.5)
ALT: 19 U/L (ref 6–29)
AST: 18 U/L (ref 10–30)
Albumin: 4 g/dL (ref 3.6–5.1)
Alkaline phosphatase (APISO): 34 U/L (ref 31–125)
BUN: 17 mg/dL (ref 7–25)
CO2: 24 mmol/L (ref 20–32)
Calcium: 9.1 mg/dL (ref 8.6–10.2)
Chloride: 106 mmol/L (ref 98–110)
Creat: 0.75 mg/dL (ref 0.50–1.10)
GFR, Est African American: 116 mL/min/{1.73_m2} (ref >=60)
GFR, Est Non African American: 100 mL/min/{1.73_m2} (ref >=60)
Globulin: 2.2 g/dL (calc) (ref 1.9–3.7)
Glucose, Bld: 78 mg/dL (ref 65–99)
Potassium: 4.2 mmol/L (ref 3.5–5.3)
Sodium: 139 mmol/L (ref 135–146)
Total Bilirubin: 0.3 mg/dL (ref 0.2–1.2)
Total Protein: 6.2 g/dL (ref 6.1–8.1)

## 2018-09-13 LAB — CBC WITH DIFFERENTIAL/PLATELET
Absolute Monocytes: 375 cells/uL (ref 200–950)
Basophils Absolute: 22 cells/uL (ref 0–200)
Basophils Relative: 0.4 %
Eosinophils Absolute: 50 cells/uL (ref 15–500)
Eosinophils Relative: 0.9 %
HCT: 29.7 % — ABNORMAL LOW (ref 35.0–45.0)
Hemoglobin: 9 g/dL — ABNORMAL LOW (ref 11.7–15.5)
Lymphs Abs: 2229 cells/uL (ref 850–3900)
MCH: 21.6 pg — ABNORMAL LOW (ref 27.0–33.0)
MCHC: 30.3 g/dL — ABNORMAL LOW (ref 32.0–36.0)
MCV: 71.2 fL — ABNORMAL LOW (ref 80.0–100.0)
MPV: 10.3 fL (ref 7.5–12.5)
Monocytes Relative: 6.7 %
Neutro Abs: 2923 cells/uL (ref 1500–7800)
Neutrophils Relative %: 52.2 %
Platelets: 354 10*3/uL (ref 140–400)
RBC: 4.17 10*6/uL (ref 3.80–5.10)
RDW: 16.3 % — ABNORMAL HIGH (ref 11.0–15.0)
Total Lymphocyte: 39.8 %
WBC: 5.6 10*3/uL (ref 3.8–10.8)

## 2018-09-13 LAB — HEPATITIS C ANTIBODY
Hepatitis C Ab: NONREACTIVE
SIGNAL TO CUT-OFF: 0.01 (ref <1.00)

## 2018-09-13 LAB — VITAMIN D 25 HYDROXY (VIT D DEFICIENCY, FRACTURES): Vit D, 25-Hydroxy: 24 ng/mL — ABNORMAL LOW (ref 30–100)

## 2018-09-13 LAB — IRON,TIBC AND FERRITIN PANEL
%SAT: 4 % (calc) — ABNORMAL LOW (ref 16–45)
Ferritin: 2 ng/mL — ABNORMAL LOW (ref 16–154)
Iron: 16 ug/dL — ABNORMAL LOW (ref 40–190)
TIBC: 433 mcg/dL (calc) (ref 250–450)

## 2018-09-13 LAB — LIPID PANEL
Cholesterol: 195 mg/dL (ref <200)
HDL: 55 mg/dL (ref >=50)
LDL Cholesterol (Calc): 124 mg/dL (calc) — ABNORMAL HIGH
Non-HDL Cholesterol (Calc): 140 mg/dL (calc) — ABNORMAL HIGH (ref <130)
Total CHOL/HDL Ratio: 3.5 (calc) (ref <5.0)
Triglycerides: 69 mg/dL (ref <150)

## 2018-09-13 LAB — B12 AND FOLATE PANEL
Folate: 12.3 ng/mL
Vitamin B-12: 285 pg/mL (ref 200–1100)

## 2018-09-13 LAB — HIV ANTIBODY (ROUTINE TESTING W REFLEX): HIV 1&2 Ab, 4th Generation: NONREACTIVE

## 2018-09-13 LAB — TSH: TSH: 1.12 mIU/L

## 2018-09-14 ENCOUNTER — Encounter: Payer: Self-pay | Admitting: Family Medicine

## 2018-09-14 DIAGNOSIS — E538 Deficiency of other specified B group vitamins: Secondary | ICD-10-CM

## 2018-09-19 MED ORDER — B-12 COMPLIANCE INJECTION 1000 MCG/ML IJ KIT: 1.0000 mL | PACK | INTRAMUSCULAR | 3 refills | Status: DC

## 2018-10-10 ENCOUNTER — Encounter: Payer: Self-pay | Admitting: Family Medicine

## 2018-10-10 ENCOUNTER — Ambulatory Visit (INDEPENDENT_AMBULATORY_CARE_PROVIDER_SITE_OTHER): Payer: BLUE CROSS/BLUE SHIELD | Admitting: Family Medicine

## 2018-10-10 ENCOUNTER — Other Ambulatory Visit: Payer: Self-pay

## 2018-10-10 DIAGNOSIS — F32A Depression, unspecified: Secondary | ICD-10-CM

## 2018-10-10 DIAGNOSIS — L509 Urticaria, unspecified: Secondary | ICD-10-CM

## 2018-10-10 DIAGNOSIS — Z8659 Personal history of other mental and behavioral disorders: Secondary | ICD-10-CM

## 2018-10-10 DIAGNOSIS — F419 Anxiety disorder, unspecified: Secondary | ICD-10-CM | POA: Diagnosis not present

## 2018-10-10 DIAGNOSIS — F329 Major depressive disorder, single episode, unspecified: Secondary | ICD-10-CM

## 2018-10-10 MED ORDER — HYDROXYZINE PAMOATE 50 MG PO CAPS: ORAL_CAPSULE | ORAL | 1 refills | Status: DC

## 2018-10-10 MED ORDER — SERTRALINE HCL 100 MG PO TABS: 150.0000 mg | ORAL_TABLET | Freq: Every day | ORAL | 1 refills | Status: DC

## 2018-10-10 MED ORDER — LEVOCETIRIZINE DIHYDROCHLORIDE 5 MG PO TABS: 5.0000 mg | ORAL_TABLET | Freq: Every morning | ORAL | 1 refills | Status: DC

## 2018-10-10 NOTE — Progress Notes (Signed)
Name: Sandra Munoz   MRN: [id number]    DOB: October 21, 1980   Date:10/10/2018       Progress Note  Subjective  Chief Complaint  Chief Complaint  Patient presents with  . Follow-up  . Medication Refill    I connected with  Kathie Dike  on 10/10/18 at  9:20 AM EDT by a video enabled telemedicine application and verified that I am speaking with the correct person using two identifiers.  I discussed the limitations of evaluation and management by telemedicine and the availability of in person appointments. The patient expressed understanding and agreed to proceed. Staff also discussed with the patient that there may be a patient responsible charge related to this service. Patient Location: Home Provider Location: Office Additional Individuals present: none  HPI  Urticaria: She was seen in the ER 08/22/2018, was given prednisone, pepcid, and benadryl; also given epipen PRN.  She completed this course and did see improvement of the hives yesterday, but they returned again today.  She saw an allergist on 08/26/2018, had blood work done, and was told to wait things out with no additional treatment at this time. She has been taking xyzal, and this is working well - gets hives on days she doesn't take her Xyzal  She denies chest tightness, shortness of breath, oral swelling.  She has been logging her foods and trying to ID triggers.  She has been avoiding all corn - even eating grass fed meats only to avoid corn in the animal products. Has derm appt this week; if nothing yielded from that visit, will refer to different allergist.   Anxiety and Depression and OCD: Diagnosed with OCD about 7-8 years ago.  Mom had Bipolar 1.  She manages her symptoms with CBT, taking 167m Zoloft daily which helps her significantly.  She has not found counseling here yet, but has been working on some workbooks from her prior counselor. No SI/HI. Her anxiety has been elevated lately with her urticaria - taking 150 of Zoloft  and this seems to be working really well for her - not ready to come down to [id number].    Patient Active Problem List   Diagnosis Date Noted  . History of OCD (obsessive compulsive disorder) 08/30/2018  . Anxiety and depression 08/30/2018  . Hx of cold sores 08/30/2018  . Urticaria of unknown origin 02/13/2013    Past Surgical History:  Procedure Laterality Date  . ABDOMINAL HYSTERECTOMY    . GASTRIC BYPASS      Family History  Problem Relation Age of Onset  . Cancer Mother   . Autoimmune disease Mother     Social History   Socioeconomic History  . Marital status: Married    Spouse name: ChHarrell Gave. Number of children: 1  . Years of education: Not on file  . Highest education level: Not on file  Occupational History  . Not on file  Social Needs  . Financial resource strain: Not hard at all  . Food insecurity    Worry: Never true    Inability: Never true  . Transportation needs    Medical: No    Non-medical: No  Tobacco Use  . Smoking status: Former Smoker    Types: Cigarettes    Quit date: 09/30/2009    Years since quitting: 9.0  . Smokeless tobacco: Never Used  Substance and Sexual Activity  . Alcohol use: Yes    Frequency: Never    Comment: ocasional  . Drug use: Never  .  Sexual activity: Yes    Birth control/protection: Surgical  Lifestyle  . Physical activity    Days per week: 3 days    Minutes per session: 60 min  . Stress: Only a little  Relationships  . Social connections    Talks on phone: More than three times a week    Gets together: More than three times a week    Attends religious service: 1 to 4 times per year    Active member of club or organization: Not on file    Attends meetings of clubs or organizations: Never    Relationship status: Married  . Intimate partner violence    Fear of current or ex partner: No    Emotionally abused: No    Physically abused: No    Forced sexual activity: No  Other Topics Concern  . Not on file   Social History Narrative  . Not on file     Current Outpatient Medications:  .  Cyanocobalamin (B-12 COMPLIANCE INJECTION) 1000 MCG/ML KIT, Inject 1 mL as directed every 30 (thirty) days., Disp: 1 kit, Rfl: 3 .  famotidine (PEPCID) 20 MG tablet, Take 1 tablet (20 mg total) by mouth 2 (two) times daily., Disp: 60 tablet, Rfl: 0 .  hydrOXYzine (VISTARIL) 50 MG capsule, TAKE 1 TO 2 CAPSULES BY MOUTH THREE TIMES DAILY AS NEEDED FOR ANXIETY INSOMNIA., Disp: , Rfl:  .  levocetirizine (XYZAL) 5 MG tablet, Take 1 tablet (5 mg total) by mouth every morning., Disp: 30 tablet, Rfl: 1 .  Probiotic Product (PROBIOTIC-10) CHEW, , Disp: , Rfl:  .  sertraline (ZOLOFT) 100 MG tablet, 150 mg. , Disp: , Rfl:  .  valACYclovir (VALTREX) 500 MG tablet, , Disp: , Rfl:   No Known Allergies  I personally reviewed active problem list, medication list, allergies, health maintenance, notes from last encounter, lab results with the patient/caregiver today.   ROS  Constitutional: Negative for fever or weight change.  Respiratory: Negative for cough and shortness of breath.   Cardiovascular: Negative for chest pain or palpitations.  Gastrointestinal: Negative for abdominal pain, no bowel changes.  Musculoskeletal: Negative for gait problem or joint swelling.  Skin: Negative for rash.  Neurological: Negative for dizziness or headache.  No other specific complaints in a complete review of systems (except as listed in HPI above).  Objective  Virtual encounter, vitals not obtained.  There is no height or weight on file to calculate BMI.  Physical Exam  Constitutional: Patient appears well-developed and well-nourished. No distress.  HENT: Head: Normocephalic and atraumatic.  Neck: Normal range of motion. Pulmonary/Chest: Effort normal. No respiratory distress. Speaking in complete sentences Neurological: Pt is alert and oriented to person, place, and time. Coordination, speech are normal.  Psychiatric:  Patient has a normal mood and affect. behavior is normal. Judgment and thought content normal.  No results found for this or any previous visit (from the past 72 hour(s)).  PHQ2/9: Depression screen [facility] 2/9 10/10/2018 09/09/2018 08/30/2018  Decreased Interest 0 0 0  Down, Depressed, Hopeless 0 0 0  PHQ - 2 Score 0 0 0  Altered sleeping 0 0 0  Tired, decreased energy 0 0 0  Change in appetite 0 0 0  Feeling bad or failure about yourself  0 0 0  Trouble concentrating 0 0 0  Moving slowly or fidgety/restless 0 0 0  Suicidal thoughts 0 0 0  PHQ-9 Score 0 0 0  Difficult doing work/chores Not difficult at all Not difficult  at all Not difficult at all   PHQ-2/9 Result is negative.    Fall Risk: Fall Risk  10/10/2018 09/09/2018 08/30/2018  Falls in the past year? 0 0 0  Number falls in past yr: 0 0 0  Injury with Fall? 0 0 0  Follow up Falls evaluation completed Falls evaluation completed Falls evaluation completed    Assessment & Plan  1. Urticaria of unknown origin - Derm appt this week - will call if needing 2nd opinion from allergist - hydrOXYzine (VISTARIL) 50 MG capsule; TAKE 1 TO 2 CAPSULES BY MOUTH THREE TIMES DAILY AS NEEDED FOR ANXIETY INSOMNIA.  Dispense: 90 capsule; Refill: 1 - levocetirizine (XYZAL) 5 MG tablet; Take 1 tablet (5 mg total) by mouth every morning.  Dispense: 90 tablet; Refill: 1  2. History of OCD (obsessive compulsive disorder) - sertraline (ZOLOFT) 100 MG tablet; Take 1.5 tablets (150 mg total) by mouth daily.  Dispense: 135 tablet; Refill: 1  3. Anxiety and depression - sertraline (ZOLOFT) 100 MG tablet; Take 1.5 tablets (150 mg total) by mouth daily.  Dispense: 135 tablet; Refill: 1   I discussed the assessment and treatment plan with the patient. The patient was provided an opportunity to ask questions and all were answered. The patient agreed with the plan and demonstrated an understanding of the instructions.  The patient was advised to call back or  seek an in-person evaluation if the symptoms worsen or if the condition fails to improve as anticipated.  I provided 12 minutes of non-face-to-face time during this encounter.

## 2018-10-17 ENCOUNTER — Encounter: Payer: Self-pay | Admitting: Family Medicine

## 2018-11-22 DIAGNOSIS — Z23 Encounter for immunization: Secondary | ICD-10-CM | POA: Diagnosis not present

## 2018-11-30 ENCOUNTER — Telehealth: Payer: Self-pay | Admitting: Family Medicine

## 2018-11-30 NOTE — Telephone Encounter (Signed)
-----   Message from Hubbard Hartshorn, FNP sent at 09/11/2018 12:22 PM EDT ----- Regarding: Have patient come back in for repeat labs Have patient come back in for repeat labs.

## 2019-01-02 DIAGNOSIS — D2262 Melanocytic nevi of left upper limb, including shoulder: Secondary | ICD-10-CM | POA: Diagnosis not present

## 2019-01-02 DIAGNOSIS — D2272 Melanocytic nevi of left lower limb, including hip: Secondary | ICD-10-CM | POA: Diagnosis not present

## 2019-01-02 DIAGNOSIS — L501 Idiopathic urticaria: Secondary | ICD-10-CM | POA: Diagnosis not present

## 2019-01-02 DIAGNOSIS — L918 Other hypertrophic disorders of the skin: Secondary | ICD-10-CM | POA: Diagnosis not present

## 2019-01-03 ENCOUNTER — Encounter: Payer: Self-pay | Admitting: Family Medicine

## 2019-01-03 ENCOUNTER — Other Ambulatory Visit: Payer: Self-pay

## 2019-01-03 ENCOUNTER — Ambulatory Visit (INDEPENDENT_AMBULATORY_CARE_PROVIDER_SITE_OTHER): Payer: BLUE CROSS/BLUE SHIELD | Admitting: Family Medicine

## 2019-01-03 DIAGNOSIS — F422 Mixed obsessional thoughts and acts: Secondary | ICD-10-CM | POA: Diagnosis not present

## 2019-01-03 DIAGNOSIS — F419 Anxiety disorder, unspecified: Secondary | ICD-10-CM | POA: Diagnosis not present

## 2019-01-03 DIAGNOSIS — F329 Major depressive disorder, single episode, unspecified: Secondary | ICD-10-CM

## 2019-01-03 DIAGNOSIS — F32A Depression, unspecified: Secondary | ICD-10-CM

## 2019-01-03 MED ORDER — ESCITALOPRAM OXALATE 10 MG PO TABS: ORAL_TABLET | ORAL | 1 refills | Status: DC

## 2019-01-03 NOTE — Progress Notes (Signed)
Name: Sandra Munoz   MRN: [id number]    DOB: 1980/03/19   Date:01/03/2019       Progress Note  Subjective  Chief Complaint  Chief Complaint  Patient presents with  . Follow-up    diiscuss anxiety medication     I connected with  Kathie Dike on 01/03/19 at  8:20 AM EST by telephone and verified that I am speaking with the correct person using two identifiers.   I discussed the limitations, risks, security and privacy concerns of performing an evaluation and management service by telephone and the availability of in person appointments. Staff also discussed with the patient that there may be a patient responsible charge related to this service. Patient Location: Home Provider Location: Office Additional Individuals present: None  HPI  Anxiety and Depression and OCD: She has been taking 137m Zoloft, and her anxiety and OCD symptoms are worsening.  She is having trouble concentrating because her mind is racing.  OCD symptoms - perseverating on tasks that are done by others - feeling like she needs to go back and re-do these tasks; having trouble controlling her outbursts. Doing her laundry, cooking dinner, loading the dishwasher, etc.   Diagnosed with OCD about 7-8 years ago.  Mom had Bipolar 1.  She has taken 205min the past of Zoloft but had headaches, so she is more interested in switching her medication completely.  She has been on Lexapro in the past - was taken off due to pregnancy and was switched to zoloft.  The Lexapro did work well for her symptoms at that time.   Patient Active Problem List   Diagnosis Date Noted  . History of OCD (obsessive compulsive disorder) 08/30/2018  . Anxiety and depression 08/30/2018  . Hx of cold sores 08/30/2018  . Urticaria of unknown origin 02/13/2013    Social History   Tobacco Use  . Smoking status: Former Smoker    Types: Cigarettes    Quit date: 09/30/2009    Years since quitting: 9.2  . Smokeless tobacco: Never Used   Substance Use Topics  . Alcohol use: Yes    Frequency: Never    Comment: ocasional     Current Outpatient Medications:  .  Cyanocobalamin (B-12 COMPLIANCE INJECTION) 1000 MCG/ML KIT, Inject 1 mL as directed every 30 (thirty) days., Disp: 1 kit, Rfl: 3 .  hydrOXYzine (VISTARIL) 50 MG capsule, TAKE 1 TO 2 CAPSULES BY MOUTH THREE TIMES DAILY AS NEEDED FOR ANXIETY INSOMNIA., Disp: 90 capsule, Rfl: 1 .  levocetirizine (XYZAL) 5 MG tablet, Take 1 tablet (5 mg total) by mouth every morning., Disp: 90 tablet, Rfl: 1 .  Probiotic Product (PROBIOTIC-10) CHEW, , Disp: , Rfl:  .  valACYclovir (VALTREX) 500 MG tablet, , Disp: , Rfl:  .  escitalopram (LEXAPRO) 10 MG tablet, Take 1/2 tablet once daily for 7 days, then increase to 1 tablet once daily ongoing. Taper off Zoloft per instructions., Disp: 90 tablet, Rfl: 1  No Known Allergies  I personally reviewed active problem list, medication list, allergies, notes from last encounter, lab results with the patient/caregiver today.  ROS  Ten systems reviewed and is negative except as mentioned in HPI  Objective  Virtual encounter, vitals not obtained.  There is no height or weight on file to calculate BMI.  Nursing Note and Vital Signs reviewed.  Physical Exam  Pulmonary/Chest: Effort normal. No respiratory distress. Speaking in complete sentences Neurological: Pt is alert and oriented to person, place, and time. Speech is  normal. Psychiatric: Patient has a normal mood and affect. behavior is normal. Judgment and thought content normal.  GAD 7 : Generalized Anxiety Score 01/03/2019  Nervous, Anxious, on Edge 3  Control/stop worrying 2  Worry too much - different things 2  Trouble relaxing 3  Restless 3  Easily annoyed or irritable 3  Afraid - awful might happen 0  Total GAD 7 Score 16  Anxiety Difficulty Very difficult    Depression screen [facility] 2/9 01/03/2019 10/10/2018 09/09/2018 08/30/2018  Decreased Interest 1 0 0 0  Down, Depressed,  Hopeless 0 0 0 0  PHQ - 2 Score 1 0 0 0  Altered sleeping 0 0 0 0  Tired, decreased energy 0 0 0 0  Change in appetite 0 0 0 0  Feeling bad or failure about yourself  0 0 0 0  Trouble concentrating 1 0 0 0  Moving slowly or fidgety/restless 0 0 0 0  Suicidal thoughts 0 0 0 0  PHQ-9 Score 2 0 0 0  Difficult doing work/chores Not difficult at all Not difficult at all Not difficult at all Not difficult at all   GAD-7 Score is quite elevated; PHQ-9 score is negative.  No results found for this or any previous visit (from the past 72 hour(s)).  Assessment & Plan  1. Mixed obsessional thoughts and acts - We will work on assisting her with locating a CBT therapist to helpw ith her symptoms.  She is very agreeable to CBT. - Ambulatory referral to Psychology - escitalopram (LEXAPRO) 10 MG tablet; Take 1/2 tablet once daily for 7 days, then increase to 1 tablet once daily ongoing. Taper off Zoloft per instructions.  Dispense: 90 tablet; Refill: 1 - Taper off of Zoloft and onto Lexapro as she has done well with this medication int he past.  Start at 28m, may consider increasing if needed at 4-6 week follow up if symptoms still not well controlled.   2. Anxiety and depression - Ambulatory referral to Psychology - escitalopram (LEXAPRO) 10 MG tablet; Take 1/2 tablet once daily for 7 days, then increase to 1 tablet once daily ongoing. Taper off Zoloft per instructions.  Dispense: 90 tablet; Refill: 1  Taper instructions: - Week 1: Take 1 tablet (1010m of Zoloft once daily + Take 1/2 tablet of Lexapro once daily - Week 2: Take 1/2 of Zoloft once daily + Take 1 tablet of Lexapro once daily. - Week 3: STOP Zoloft + Continue taking Lexapro 1 tablet once daily ongoing.  - I discussed the assessment and treatment plan with the patient. The patient was provided an opportunity to ask questions and all were answered. The patient agreed with the plan and demonstrated an understanding of the instructions.   - The patient was advised to call back or seek an in-person evaluation if the symptoms worsen or if the condition fails to improve as anticipated.  I provided 16 minutes of non-face-to-face time during this encounter.  EmHubbard HartshornFNP

## 2019-01-03 NOTE — Patient Instructions (Signed)
Week 1: Take 1 tablet (100mg ) of Zoloft once daily + Take 1/2 tablet of Lexapro once daily  Week 2: Take 1/2 of Zoloft once daily + Take 1 tablet of Lexapro once daily.  Week 3: STOP Zoloft + Continue taking Lexapro 1 tablet once daily ongoing.

## 2019-01-13 DIAGNOSIS — Z20828 Contact with and (suspected) exposure to other viral communicable diseases: Secondary | ICD-10-CM | POA: Diagnosis not present

## 2019-01-31 ENCOUNTER — Other Ambulatory Visit: Payer: Self-pay

## 2019-01-31 ENCOUNTER — Encounter: Payer: Self-pay | Admitting: Family Medicine

## 2019-01-31 ENCOUNTER — Ambulatory Visit (INDEPENDENT_AMBULATORY_CARE_PROVIDER_SITE_OTHER): Payer: BLUE CROSS/BLUE SHIELD | Admitting: Family Medicine

## 2019-01-31 DIAGNOSIS — F329 Major depressive disorder, single episode, unspecified: Secondary | ICD-10-CM

## 2019-01-31 DIAGNOSIS — Z8659 Personal history of other mental and behavioral disorders: Secondary | ICD-10-CM

## 2019-01-31 DIAGNOSIS — F422 Mixed obsessional thoughts and acts: Secondary | ICD-10-CM | POA: Diagnosis not present

## 2019-01-31 DIAGNOSIS — F419 Anxiety disorder, unspecified: Secondary | ICD-10-CM | POA: Diagnosis not present

## 2019-01-31 DIAGNOSIS — F32A Depression, unspecified: Secondary | ICD-10-CM

## 2019-01-31 NOTE — Progress Notes (Signed)
Name: Sandra Munoz   MRN: [id number]    DOB: 06-18-1980   Date:01/31/2019       Progress Note  Subjective  Chief Complaint  Chief Complaint  Patient presents with   Follow-up    4 week recheck    I connected with  Kathie Dike on 01/31/19 at  7:40 AM EST by telephone and verified that I am speaking with the correct person using two identifiers.   I discussed the limitations, risks, security and privacy concerns of performing an evaluation and management service by telephone and the availability of in person appointments. Staff also discussed with the patient that there may be a patient responsible charge related to this service. Patient Location: Home Provider Location: Office Additional Individuals present: None  HPI  Anxiety and Depression and OCD: Follow up from 4 weeks prior when we switched her off of Zoloft and onto Lexapro.  She had headaches initially, but these have now resolved.  She feels like her medication is starting to work and her symptoms are decreasing.  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  She is not perseverating as much on tasks done by others, allowing her husband to cook dinner a few times a week.  She has not yet heard from [facility] - I have provided the phone number for her today.  Interim History from 01/03/2019: She has been taking 155m Zoloft, and her anxiety and OCD symptoms are worsening.  She is having trouble concentrating because her mind is racing.  OCD symptoms - perseverating on tasks that are done by others - feeling like she needs to go back and re-do these tasks; having trouble controlling her outbursts. Doing her laundry, cooking dinner, loading the dishwasher, etc.   Diagnosed with OCD about 7-8 years ago. Mom had Bipolar 1.  She has taken 2021min the past of Zoloft but had headaches, so she is more interested in switching her medication completely.  She has  been on Lexapro in the past - was taken off due to pregnancy and was switched to zoloft.  The Lexapro did work well for her symptoms at that time.   Patient Active Problem List   Diagnosis Date Noted   History of OCD (obsessive compulsive disorder) 08/30/2018   Anxiety and depression 08/30/2018   Hx of cold sores 08/30/2018   Urticaria of unknown origin 02/13/2013    Social History   Tobacco Use   Smoking status: Former Smoker    Types: Cigarettes    Quit date: 09/30/2009    Years since quitting: 9.3   Smokeless tobacco: Never Used  Substance Use Topics   Alcohol use: Yes    Frequency: Never    Comment: ocasional     Current Outpatient Medications:    Cyanocobalamin (B-12 COMPLIANCE INJECTION) 1000 MCG/ML KIT, Inject 1 mL as directed every 30 (thirty) days., Disp: 1 kit, Rfl: 3   escitalopram (LEXAPRO) 10 MG tablet, Take 1/2 tablet once daily for 7 days, then increase to 1 tablet once daily ongoing. Taper off Zoloft per instructions., Disp: 90 tablet, Rfl: 1   hydrOXYzine (VISTARIL) 50 MG capsule, TAKE 1 TO 2 CAPSULES BY MOUTH THREE TIMES DAILY AS NEEDED FOR ANXIETY INSOMNIA., Disp: 90 capsule, Rfl: 1   levocetirizine (XYZAL) 5 MG tablet, Take 1 tablet (5 mg total) by mouth every morning., Disp: 90 tablet, Rfl: 1   Probiotic Product (PROBIOTIC-10) CHEW, , Disp: ,  Rfl:    valACYclovir (VALTREX) 500 MG tablet, , Disp: , Rfl:   No Known Allergies  I personally reviewed active problem list, medication list, allergies, notes from last encounter, lab results with the patient/caregiver today.  ROS  Ten systems reviewed and is negative except as mentioned in HPI  Objective  Virtual encounter, vitals not obtained.  There is no height or weight on file to calculate BMI.  Nursing Note and Vital Signs reviewed.  Physical Exam  Pulmonary/Chest: Effort normal. No respiratory distress. Speaking in complete sentences Neurological: Pt is alert and oriented to person,  place, and time. Coordination, speech and gait are normal.  Psychiatric: Patient has a normal mood and affect. behavior is normal. Judgment and thought content normal.  No results found for this or any previous visit (from the past 72 hour(s)).  Depression screen [facility] 2/9 01/31/2019 01/03/2019 10/10/2018 09/09/2018 08/30/2018  Decreased Interest 1 1 0 0 0  Down, Depressed, Hopeless 0 0 0 0 0  PHQ - 2 Score 1 1 0 0 0  Altered sleeping 2 0 0 0 0  Tired, decreased energy 0 0 0 0 0  Change in appetite 0 0 0 0 0  Feeling bad or failure about yourself  0 0 0 0 0  Trouble concentrating 2 1 0 0 0  Moving slowly or fidgety/restless 0 0 0 0 0  Suicidal thoughts 0 0 0 0 0  PHQ-9 Score 5 2 0 0 0  Difficult doing work/chores Not difficult at all Not difficult at all Not difficult at all Not difficult at all Not difficult at all   PHQ-9 score is positive - treatment as below.  Assessment & Plan  1. Mixed obsessional thoughts and acts 2. Anxiety and depression 3. History of OCD (obsessive compulsive disorder) - Symptoms are improving with 48m Lexapro, will follow up in 6-8 weeks for routine follow up.  If not reaching full efficacy, may consider 15-244mdosing.  She is proactively seeking CBT as well- number for [facility] is provided, and I encouraged her to call today to schedule.  She is very agreeable to this plan of care.   - I discussed the assessment and treatment plan with the patient. The patient was provided an opportunity to ask questions and all were answered. The patient agreed with the plan and demonstrated an understanding of the instructions.  - The patient was advised to call back or seek an in-person evaluation if the symptoms worsen or if the condition fails to improve as anticipated.  I provided 12 minutes of non-face-to-face time during this encounter.  EmHubbard HartshornFNP

## 2019-02-08 ENCOUNTER — Other Ambulatory Visit: Payer: Self-pay | Admitting: Family Medicine

## 2019-02-08 DIAGNOSIS — L509 Urticaria, unspecified: Secondary | ICD-10-CM

## 2019-02-16 ENCOUNTER — Encounter: Payer: Self-pay | Admitting: Family Medicine

## 2019-02-16 DIAGNOSIS — F32A Depression, unspecified: Secondary | ICD-10-CM

## 2019-02-16 DIAGNOSIS — F329 Major depressive disorder, single episode, unspecified: Secondary | ICD-10-CM

## 2019-02-16 DIAGNOSIS — F419 Anxiety disorder, unspecified: Secondary | ICD-10-CM

## 2019-02-16 DIAGNOSIS — F422 Mixed obsessional thoughts and acts: Secondary | ICD-10-CM

## 2019-02-16 MED ORDER — ESCITALOPRAM OXALATE 10 MG PO TABS: 15.0000 mg | ORAL_TABLET | Freq: Every day | ORAL | 0 refills | Status: DC

## 2019-03-06 ENCOUNTER — Other Ambulatory Visit: Payer: Self-pay | Admitting: Family Medicine

## 2019-03-06 NOTE — Telephone Encounter (Signed)
Requested medication (s) are due for refill today: yes  Requested medication (s) are on the active medication list: yes  Last refill:  02/08/2019  Future visit scheduled: yes  Notes to clinic: Medication not assigned to a protocol, review manuallyc:     Requested Prescriptions  Pending Prescriptions Disp Refills   cyanocobalamin (,VITAMIN B-12,) 1000 MCG/ML injection [Pharmacy Med Name: CYANOCOBALAMIN 1,000 MCG/ML] 1 mL 1    Sig: INJECT 1 ML INTO THE SKIN INTRAMUSCULARLY ONCE EVERY MONTH      Off-Protocol Failed - 03/06/2019  2:32 PM      Failed - Medication not assigned to a protocol, review manually.      Passed - Valid encounter within last 12 months    Recent Outpatient Visits           1 month ago Mixed obsessional thoughts and acts   [facility] [place], Gerome Apley, FNP   2 months ago Mixed obsessional thoughts and acts   [facility], Gerome Apley, FNP   4 months ago Urticaria of unknown origin   [facility] [place], Gerome Apley, FNP   5 months ago Well woman exam (no gynecological exam)   [facility] Doren Custard, FNP   6 months ago Urticaria   [facility] Doren Custard, FNP       Future Appointments             In 3 days Doren Custard, FNP [facility]           Off-Protocol Failed - 03/06/2019  2:32 PM      Failed - Medication not assigned to a protocol, review manually.      Passed - Valid encounter within last 12 months    Recent Outpatient Visits           1 month ago Mixed obsessional thoughts and acts   [facility] [place], Gerome Apley, FNP   2 months ago Mixed obsessional thoughts and acts   [facility] [place], Gerome Apley, FNP   4 months ago Urticaria of unknown origin   [facility] [place], Gerome Apley, FNP   5 months ago Well woman exam (no gynecological exam)   [facility] Doren Custard, FNP   6 months ago Urticaria   [facility] Doren Custard, FNP       Future Appointments             In 3 days Doren Custard, FNP [facility], [facility]

## 2019-03-09 ENCOUNTER — Ambulatory Visit (INDEPENDENT_AMBULATORY_CARE_PROVIDER_SITE_OTHER): Payer: BLUE CROSS/BLUE SHIELD | Admitting: Family Medicine

## 2019-03-09 ENCOUNTER — Encounter: Payer: Self-pay | Admitting: Family Medicine

## 2019-03-09 ENCOUNTER — Other Ambulatory Visit: Payer: Self-pay

## 2019-03-09 DIAGNOSIS — F32A Depression, unspecified: Secondary | ICD-10-CM

## 2019-03-09 DIAGNOSIS — Z8659 Personal history of other mental and behavioral disorders: Secondary | ICD-10-CM

## 2019-03-09 DIAGNOSIS — F419 Anxiety disorder, unspecified: Secondary | ICD-10-CM | POA: Diagnosis not present

## 2019-03-09 DIAGNOSIS — F329 Major depressive disorder, single episode, unspecified: Secondary | ICD-10-CM

## 2019-03-09 DIAGNOSIS — Z8619 Personal history of other infectious and parasitic diseases: Secondary | ICD-10-CM | POA: Diagnosis not present

## 2019-03-09 MED ORDER — VALACYCLOVIR HCL 500 MG PO TABS: ORAL_TABLET | ORAL | 1 refills | Status: DC

## 2019-03-09 MED ORDER — BUSPIRONE HCL 10 MG PO TABS: ORAL_TABLET | ORAL | 0 refills | Status: DC

## 2019-03-09 NOTE — Progress Notes (Signed)
Name: Sandra Munoz   MRN: [id number]    DOB: 1980-09-03   Date:03/09/2019       Progress Note  Subjective  Chief Complaint  Chief Complaint  Patient presents with   Depression    follow up increase in medication    I connected with  Kathie Dike on 03/09/19 at  7:20 AM EST by telephone and verified that I am speaking with the correct person using two identifiers.  I discussed the limitations, risks, security and privacy concerns of performing an evaluation and management service by telephone and the availability of in person appointments. Staff also discussed with the patient that there may be a patient responsible charge related to this service. Patient Location: Home Provider Location: Office Additional Individuals present: Office  HPI  Anxiety and Depression and SLP:NPYYFR up from 4-5 weeks prior when we increased her Lexapro from 33m to 175m  She feels like her medication is working well and her symptoms are decreasing -  She is not perseverating as much on tasks done by others, allowing her husband to cook dinner a few times a week.  She is still having some trouble sleeping and concentrating because she cannot "turn her brain off" at night.  Taking hydroxyzine nearly nightly at this point.  She started with her EAP - has to exhaust this benefit prior to starting with [facility] or other OP counseling.  She feels like the CBT is effective for her.  Denies SI/HI  [facility] 03/09/2019 01/31/2019 01/03/2019 10/10/2018 09/09/2018  Decreased Interest '1 1 1 ' 0 0  Down, Depressed, Hopeless 1 0 0 0 0  PHQ - 2 Score '2 1 1 ' 0 0  Altered sleeping 3 2 0 0 0  Tired, decreased energy 1 0 0 0 0  Change in appetite 1 0 0 0 0  Feeling bad or failure about yourself  1 0 0 0 0  Trouble concentrating '3 2 1 ' 0 0  Moving slowly or fidgety/restless 1 0 0 0 0  Suicidal thoughts 0 0 0 0 0  PHQ-9 Score '12 5 2 ' 0 0  Difficult doing work/chores Somewhat difficult Not difficult at all Not  difficult at all Not difficult at all Not difficult at all     Patient Active Problem List   Diagnosis Date Noted   History of OCD (obsessive compulsive disorder) 08/30/2018   Anxiety and depression 08/30/2018   Hx of cold sores 08/30/2018   Urticaria of unknown origin 02/13/2013    Past Surgical History:  Procedure Laterality Date   ABDOMINAL HYSTERECTOMY     GASTRIC BYPASS      Family History  Problem Relation Age of Onset   Cancer Mother    Autoimmune disease Mother     Social History   Socioeconomic History   Marital status: Married    Spouse name: ChHarrell Gave Number of children: 1   Years of education: Not on file   Highest education level: Not on file  Occupational History   Not on file  Tobacco Use   Smoking status: Former Smoker    Types: Cigarettes    Quit date: 09/30/2009    Years since quitting: 9.4   Smokeless tobacco: Never Used  Substance and Sexual Activity   Alcohol use: Yes    Comment: ocasional   Drug use: Never   Sexual activity: Yes    Birth control/protection: Surgical  Other Topics Concern   Not on file  Social History Narrative  Not on file   Social Determinants of Health   Financial Resource Strain: Low Risk    Difficulty of Paying Living Expenses: Not hard at all  Food Insecurity: No Food Insecurity   Worried About Charity fundraiser in the Last Year: Never true   [place] in the Last Year: Never true  Transportation Needs: No Transportation Needs   Lack of Transportation (Medical): No   Lack of Transportation (Non-Medical): No  Physical Activity: Sufficiently Active   Days of Exercise per Week: 3 days   Minutes of Exercise per Session: 60 min  Stress: No Stress Concern Present   Feeling of Stress : Only a little  Social Connections: Slightly Isolated   Frequency of Communication with Friends and Family: More than three times a week   Frequency of Social Gatherings with Friends and  Family: More than three times a week   Attends Religious Services: 1 to 4 times per year   Active Member of [employer] or Organizations: Not on file   Attends Archivist Meetings: Never   Marital Status: Married  Human resources officer Violence: Not At Risk   Fear of Current or Ex-Partner: No   Emotionally Abused: No   Physically Abused: No   Sexually Abused: No     Current Outpatient Medications:    Cyanocobalamin (B-12 COMPLIANCE INJECTION) 1000 MCG/ML KIT, Inject 1 mL as directed every 30 (thirty) days., Disp: 1 kit, Rfl: 3   escitalopram (LEXAPRO) 10 MG tablet, Take 1.5 tablets (15 mg total) by mouth daily., Disp: 135 tablet, Rfl: 0   hydrOXYzine (VISTARIL) 50 MG capsule, TAKE 1 TO 2 CAPSULES BY MOUTH 3 TIMES A DAY AS NEEDED FOR SLEEP/ANXIETY, Disp: 90 capsule, Rfl: 0   levocetirizine (XYZAL) 5 MG tablet, Take 1 tablet (5 mg total) by mouth every morning., Disp: 90 tablet, Rfl: 1   Probiotic Product (PROBIOTIC-10) CHEW, , Disp: , Rfl:    valACYclovir (VALTREX) 500 MG tablet, , Disp: , Rfl:   No Known Allergies  I personally reviewed active problem list, medication list, allergies, notes from last encounter, lab results with the patient/caregiver today.   ROS  Ten systems reviewed and is negative except as mentioned in HPI  Objective  Virtual encounter, vitals not obtained.  There is no height or weight on file to calculate BMI.  Physical Exam  Pulmonary/Chest: Effort normal. No respiratory distress. Speaking in complete sentences Neurological: Pt is alert and oriented to person, place, and time. Speech is normal. Psychiatric: Patient has a normal mood and affect. behavior is normal. Judgment and thought content normal.  No results found for this or any previous visit (from the past 72 hour(s)).  PHQ2/9: Depression screen [facility] 2/9 03/09/2019 01/31/2019 01/03/2019 10/10/2018 09/09/2018  Decreased Interest '1 1 1 ' 0 0  Down, Depressed, Hopeless 1 0 0 0 0  PHQ - 2  Score '2 1 1 ' 0 0  Altered sleeping 3 2 0 0 0  Tired, decreased energy 1 0 0 0 0  Change in appetite 1 0 0 0 0  Feeling bad or failure about yourself  1 0 0 0 0  Trouble concentrating '3 2 1 ' 0 0  Moving slowly or fidgety/restless 1 0 0 0 0  Suicidal thoughts 0 0 0 0 0  PHQ-9 Score '12 5 2 ' 0 0  Difficult doing work/chores Somewhat difficult Not difficult at all Not difficult at all Not difficult at all Not difficult at all   PHQ-2/9 Result  is positive.    Fall Risk: Fall Risk  03/09/2019 01/31/2019 01/03/2019 10/10/2018 09/09/2018  Falls in the past year? 0 0 0 0 0  Number falls in past yr: 0 0 0 0 0  Injury with Fall? 0 0 0 0 0  Follow up Falls evaluation completed Falls evaluation completed Falls evaluation completed Falls evaluation completed Falls evaluation completed   Assessment & Plan  1. Anxiety and depression - Add buspar at night, hydroxyzine only PRN for panic attacks.  Continue Lexapro 43m. - busPIRone (BUSPAR) 10 MG tablet; Take 1 tablet once daily at night.  Dispense: 90 tablet; Refill: 0  2. History of OCD (obsessive compulsive disorder) - CBT going well. - busPIRone (BUSPAR) 10 MG tablet; Take 1 tablet once daily at night.  Dispense: 90 tablet; Refill: 0  3. Hx of cold sores - valACYclovir (VALTREX) 500 MG tablet; Take 10064mon day 1 of outbreak, then take 50076mnce daily for 3 days.  Dispense: 90 tablet; Refill: 1   I discussed the assessment and treatment plan with the patient. The patient was provided an opportunity to ask questions and all were answered. The patient agreed with the plan and demonstrated an understanding of the instructions.   The patient was advised to call back or seek an in-person evaluation if the symptoms worsen or if the condition fails to improve as anticipated.  I provided 13 minutes of non-face-to-face time during this encounter.  EmiHubbard HartshornNP

## 2019-03-21 ENCOUNTER — Encounter: Payer: Self-pay | Admitting: Family Medicine

## 2019-03-24 NOTE — Telephone Encounter (Signed)
Sandra Munoz - please draft letter for patient to be excused from Massage Subscription secondary to medical condition.

## 2019-03-29 ENCOUNTER — Encounter: Payer: Self-pay | Admitting: Family Medicine

## 2019-04-04 ENCOUNTER — Other Ambulatory Visit: Payer: Self-pay | Admitting: Family Medicine

## 2019-04-04 NOTE — Telephone Encounter (Signed)
Requested medication (s) are due for refill today: yes  Requested medication (s) are on the active medication list: yes  Last refill:  03/09/19  Future visit scheduled: yes  Notes to clinic:  medication not assigned to a protocol, please review if refill is appropriate.   Requested Prescriptions  Pending Prescriptions Disp Refills   cyanocobalamin (,VITAMIN B-12,) 1000 MCG/ML injection [Pharmacy Med Name: CYANOCOBALAMIN 1,000 MCG/ML] 1 mL 1    Sig: INJECT 1 ML INTO THE SKIN INTRAMUSCULARLY ONCE EVERY MONTH      Off-Protocol Failed - 04/04/2019  9:30 AM      Failed - Medication not assigned to a protocol, review manually.      Passed - Valid encounter within last 12 months    Recent Outpatient Visits           3 weeks ago Anxiety and depression   [facility] [place], Gerome Apley, FNP   2 months ago Mixed obsessional thoughts and acts   [facility] [place], Gerome Apley, FNP   3 months ago Mixed obsessional thoughts and acts   [facility], Gerome Apley, FNP   5 months ago Urticaria of unknown origin   [facility] Doren Custard, FNP   6 months ago Well woman exam (no gynecological exam)   [facility] Doren Custard, FNP       Future Appointments             In 1 month Danelle Berry, PA-C [facility], [facility]           Off-Protocol Failed - 04/04/2019  9:30 AM      Failed - Medication not assigned to a protocol, review manually.      Passed - Valid encounter within last 12 months    Recent Outpatient Visits           3 weeks ago Anxiety and depression   [facility] Doren Custard, FNP   2 months ago Mixed obsessional thoughts and acts   [facility], Gerome Apley, FNP   3 months ago Mixed obsessional thoughts and acts   [facility], Gerome Apley, FNP   5 months ago Urticaria of unknown origin   [facility] Doren Custard, FNP   6 months ago Well woman exam (no gynecological exam)   [facility] Doren Custard, FNP       Future Appointments             In 1 month Danelle Berry, PA-C [facility], [facility]

## 2019-05-07 ENCOUNTER — Other Ambulatory Visit: Payer: Self-pay | Admitting: Family Medicine

## 2019-05-07 DIAGNOSIS — L509 Urticaria, unspecified: Secondary | ICD-10-CM

## 2019-05-07 NOTE — Telephone Encounter (Signed)
Requested Prescriptions  Pending Prescriptions Disp Refills  . levocetirizine (XYZAL) 5 MG tablet [Pharmacy Med Name: LEVOCETIRIZINE 5 MG TABLET] 90 tablet 1    Sig: TAKE 1 TABLET BY MOUTH IN THE MORNING     Ear, Nose, and Throat:  Antihistamines Passed - 05/07/2019  9:56 AM      Passed - Valid encounter within last 12 months    Recent Outpatient Visits          1 month ago Anxiety and depression   [facility] [place], Gerome Apley, FNP   3 months ago Mixed obsessional thoughts and acts   [facility] [place], Gerome Apley, FNP   4 months ago Mixed obsessional thoughts and acts   [facility] [place], Gerome Apley, FNP   6 months ago Urticaria of unknown origin   [facility] [place], Gerome Apley, FNP   8 months ago Well woman exam (no gynecological exam)   [facility] Doren Custard, FNP      Future Appointments            In 2 days Danelle Berry, PA-C [facility], [facility]

## 2019-05-09 ENCOUNTER — Encounter: Payer: Self-pay | Admitting: Family Medicine

## 2019-05-09 ENCOUNTER — Telehealth (INDEPENDENT_AMBULATORY_CARE_PROVIDER_SITE_OTHER): Payer: BLUE CROSS/BLUE SHIELD | Admitting: Family Medicine

## 2019-05-09 VITALS — Ht 59.0 in | Wt 152.3 lb

## 2019-05-09 DIAGNOSIS — F419 Anxiety disorder, unspecified: Secondary | ICD-10-CM

## 2019-05-09 DIAGNOSIS — Z8659 Personal history of other mental and behavioral disorders: Secondary | ICD-10-CM | POA: Diagnosis not present

## 2019-05-09 DIAGNOSIS — F5105 Insomnia due to other mental disorder: Secondary | ICD-10-CM | POA: Diagnosis not present

## 2019-05-09 DIAGNOSIS — F99 Mental disorder, not otherwise specified: Secondary | ICD-10-CM

## 2019-05-09 DIAGNOSIS — F320 Major depressive disorder, single episode, mild: Secondary | ICD-10-CM | POA: Diagnosis not present

## 2019-05-09 MED ORDER — BUSPIRONE HCL 10 MG PO TABS: 10.0000 mg | ORAL_TABLET | Freq: Two times a day (BID) | ORAL | 3 refills | Status: DC | PRN

## 2019-05-09 MED ORDER — ESCITALOPRAM OXALATE 10 MG PO TABS: 15.0000 mg | ORAL_TABLET | Freq: Every day | ORAL | 3 refills | Status: DC

## 2019-05-09 NOTE — Progress Notes (Signed)
Name: Sandra Munoz   MRN: [id number]    DOB: 1980-05-12   Date:05/09/2019       Progress Note  Subjective:    I connected with  Kathie Dike  on 05/09/19 at  8:40 AM EST by a video enabled telemedicine application and verified that I am speaking with the correct person using two identifiers.  I discussed the limitations of evaluation and management by telemedicine and the availability of in person appointments. The patient expressed understanding and agreed to proceed. Staff also discussed with the patient that there may be a patient responsible charge related to this service. Patient Location:  home Provider Location: [facility] Additional Individuals present: none  Chief Complaint  Patient presents with  . Follow-up  . Depression    Sandra Munoz is a 39 y.o. female, presents for virtual visit for routine follow up on the conditions listed above.  Depression screen [facility] 2/9 05/09/2019 03/09/2019 01/31/2019  Decreased Interest 0 1 1  Down, Depressed, Hopeless 1 1 0  PHQ - 2 Score 1 2 1   Altered sleeping 0 3 2  Tired, decreased energy 0 1 0  Change in appetite 0 1 0  Feeling bad or failure about yourself  0 1 0  Trouble concentrating 0 3 2  Moving slowly or fidgety/restless 0 1 0  Suicidal thoughts 0 0 0  PHQ-9 Score 1 12 5   Difficult doing work/chores Not difficult at all Somewhat difficult Not difficult at all  PHQ is a little better lexapro 15 mg has been working much better than zoloft was She also started taking buspar 10 mg about an hour before bedtime and after about 3-4 days it really helped her with her anxious sx and allowed her mind to calm and she has been able to get to sleep better, w/o any groggy SE. She feels like she has better energy and mood throughout the day but she does continue to have a racing mind with thoughts of everything she has to do, and it does still keep her a little on edge sometimes during the day.  She notices a huge increase in sx if she forgets to  take lexapro in the morning so she has sticky notes on her bathroom mirror and a few extra pills in her purse.  GAD 7 : Generalized Anxiety Score 01/03/2019  Nervous, Anxious, on Edge 3  Control/stop worrying 2  Worry too much - different things 2  Trouble relaxing 3  Restless 3  Easily annoyed or irritable 3  Afraid - awful might happen 0  Total GAD 7 Score 16  Anxiety Difficulty Very difficult  anxiety sx much more prominent that depressive sx right now   She has hx of iron deficiency - she has continue to supplement daily with PO iron and she feels that fatigue is much better, she would like to hold off on any lab work until her physical which is coming up in 4 months.   Patient Active Problem List   Diagnosis Date Noted  . History of OCD (obsessive compulsive disorder) 08/30/2018  . Anxiety and depression 08/30/2018  . Hx of cold sores 08/30/2018  . Urticaria of unknown origin 02/13/2013    Current Outpatient Medications:  .  busPIRone (BUSPAR) 10 MG tablet, Take 1 tablet once daily at night., Disp: 90 tablet, Rfl: 0 .  cyanocobalamin (,VITAMIN B-12,) 1000 MCG/ML injection, INJECT 1 ML INTO THE SKIN INTRAMUSCULARLY ONCE EVERY MONTH, Disp: 1 mL, Rfl: 3 .  escitalopram (  LEXAPRO) 10 MG tablet, Take 1.5 tablets (15 mg total) by mouth daily., Disp: 135 tablet, Rfl: 0 .  hydrOXYzine (VISTARIL) 50 MG capsule, TAKE 1 TO 2 CAPSULES BY MOUTH 3 TIMES A DAY AS NEEDED FOR SLEEP/ANXIETY, Disp: 90 capsule, Rfl: 0 .  levocetirizine (XYZAL) 5 MG tablet, TAKE 1 TABLET BY MOUTH IN THE MORNING, Disp: 90 tablet, Rfl: 1 .  Probiotic Product (PROBIOTIC-10) CHEW, , Disp: , Rfl:  .  valACYclovir (VALTREX) 500 MG tablet, Take 1000mg  on day 1 of outbreak, then take 500mg  once daily for 3 days., Disp: 90 tablet, Rfl: 1 No Known Allergies  Past Surgical History:  Procedure Laterality Date  . ABDOMINAL HYSTERECTOMY    . GASTRIC BYPASS     Family History  Problem Relation Age of Onset  . Cancer Mother    . Autoimmune disease Mother    Social History   Socioeconomic History  . Marital status: Married    Spouse name:  . Number of children: 1  . Years of education: Not on file  . Highest education level: Not on file  Occupational History  . Not on file  Tobacco Use  . Smoking status: Former Smoker    Types: Cigarettes    Quit date: 09/30/2009    Years since quitting: 9.6  . Smokeless tobacco: Never Used  Substance and Sexual Activity  . Alcohol use: Yes    Comment: ocasional  . Drug use: Never  . Sexual activity: Yes    Birth control/protection: Surgical  Other Topics Concern  . Not on file  Social History Narrative  . Not on file   Social Determinants of Health   Financial Resource Strain: Low Risk   . Difficulty of Paying Living Expenses: Not hard at all  Food Insecurity: No Food Insecurity  . Worried About [place] in the Last Year: Never true  . Ran Out of Food in the Last Year: Never true  Transportation Needs: No Transportation Needs  . Lack of Transportation (Medical): No  . Lack of Transportation (Non-Medical): No  Physical Activity: Sufficiently Active  . Days of Exercise per Week: 3 days  . Minutes of Exercise per Session: 60 min  Stress: No Stress Concern Present  . Feeling of Stress : Only a little  Social Connections: Slightly Isolated  . Frequency of Communication with Friends and Family: More than three times a week  . Frequency of Social Gatherings with Friends and Family: More than three times a week  . Attends Religious Services: 1 to 4 times per year  . Active Member of Clubs or Organizations: Not on file  . Attends 11/30/2009 Meetings: Never  . Marital Status: Married  Programme researcher, broadcasting/film/video Violence: Not At Risk  . Fear of Current or Ex-Partner: No  . Emotionally Abused: No  . Physically Abused: No  . Sexually Abused: No    Chart Review Today: I personally reviewed active problem list, medication list, allergies,  family history, social history, health maintenance, notes from last encounter, lab results, imaging with the patient/caregiver today.   Review of Systems  10 Systems reviewed and are negative for acute change except as noted in the HPI.   Objective:    Virtual encounter, vitals limited, only able to obtain the following Today's Vitals   05/09/19 0819  Weight: 152 lb 4.8 oz (69.1 kg)  Height: 4\' 11"  (1.499 m)   Body mass index is 30.76 kg/m. Nursing Note and Vital Signs reviewed.  Physical Exam Vitals and nursing note reviewed.  Constitutional:      General: She is not in acute distress.    Appearance: Normal appearance. She is well-developed. She is not ill-appearing, toxic-appearing or diaphoretic.  HENT:     Head: Normocephalic and atraumatic.  Eyes:     General:        Right eye: No discharge.        Left eye: No discharge.     Conjunctiva/sclera: Conjunctivae normal.  Neck:     Trachea: No tracheal deviation.  Pulmonary:     Effort: Pulmonary effort is normal. No respiratory distress.     Breath sounds: No stridor.  Skin:    Coloration: Skin is not jaundiced or pale.     Findings: No rash.  Neurological:     Mental Status: She is alert.  Psychiatric:        Attention and Perception: Attention normal.        Mood and Affect: Mood and affect normal.        Speech: Speech normal.        Behavior: Behavior normal. Behavior is cooperative.        Thought Content: Thought content normal. Thought content does not include homicidal or suicidal ideation. Thought content does not include homicidal plan.        Cognition and Memory: Cognition normal.        Judgment: Judgment normal.     PE limited by telephone encounter  No results found for this or any previous visit (from the past 72 hour(s)).  PHQ2/9: Depression screen [facility] 2/9 05/09/2019 03/09/2019 01/31/2019 01/03/2019 10/10/2018  Decreased Interest 0 1 1 1  0  Down, Depressed, Hopeless 1 1 0 0 0  PHQ - 2 Score 1 2  1 1  0  Altered sleeping 0 3 2 0 0  Tired, decreased energy 0 1 0 0 0  Change in appetite 0 1 0 0 0  Feeling bad or failure about yourself  0 1 0 0 0  Trouble concentrating 0 3 2 1  0  Moving slowly or fidgety/restless 0 1 0 0 0  Suicidal thoughts 0 0 0 0 0  PHQ-9 Score 1 12 5 2  0  Difficult doing work/chores Not difficult at all Somewhat difficult Not difficult at all Not difficult at all Not difficult at all   PHQ-2/9 Result is neg - improved  Fall Risk: Fall Risk  05/09/2019 03/09/2019 01/31/2019 01/03/2019 10/10/2018  Falls in the past year? 0 0 0 0 0  Number falls in past yr: 0 0 0 0 0  Injury with Fall? 0 0 0 0 0  Follow up - Falls evaluation completed Falls evaluation completed Falls evaluation completed Falls evaluation completed     Assessment and Plan:     ICD-10-CM   1. Current mild episode of major depressive disorder, unspecified whether recurrent (HCC)  F32.0 escitalopram (LEXAPRO) 10 MG tablet    busPIRone (BUSPAR) 10 MG tablet   improving depressive sx, much better on lexapro 15 mg daily, no SE or concerns  2. Anxiety disorder, unspecified type  F41.9 escitalopram (LEXAPRO) 10 MG tablet    busPIRone (BUSPAR) 10 MG tablet   encouraged pt to also try buspar in the am to help with anxiety sx during the day, reviewed coping mechanisms and encouraged therapy   3. Insomnia due to other mental disorder  F51.05    F99    improving with buspar in the evening, encouraged healthy coping  skills, exercise, journaling, writing lists and sleep hygeine  4. History of OCD (obsessive compulsive disorder)  Z86.59 busPIRone (BUSPAR) 10 MG tablet    I discussed the assessment and treatment plan with the patient. The patient was provided an opportunity to ask questions and all were answered. The patient agreed with the plan and demonstrated an understanding of the instructions.  The patient was advised to call back or seek an in-person evaluation if the symptoms worsen or if the condition  fails to improve as anticipated.  I provided 30+ minutes of non-face-to-face time during this encounter, more than 50% of that time was on virtual encounter with pt for 15 min.  Greater than 50% of this visit was spent in direct face-to-face counseling, obtaining history and physical, discussing and educating pt on treatment plan.   Remainder of time involved but was not limited to reviewing chart (recent and pertinent OV notes and labs), documentation in EMR, and coordinating care and treatment plan.    Danelle Berry, PA-C 05/09/19 8:56 AM

## 2019-06-05 ENCOUNTER — Other Ambulatory Visit: Payer: Self-pay | Admitting: Family Medicine

## 2019-06-05 DIAGNOSIS — F320 Major depressive disorder, single episode, mild: Secondary | ICD-10-CM

## 2019-06-05 DIAGNOSIS — F419 Anxiety disorder, unspecified: Secondary | ICD-10-CM

## 2019-06-05 DIAGNOSIS — Z8659 Personal history of other mental and behavioral disorders: Secondary | ICD-10-CM

## 2019-06-05 NOTE — Telephone Encounter (Signed)
Requested Prescriptions  Pending Prescriptions Disp Refills  . busPIRone (BUSPAR) 10 MG tablet [Pharmacy Med Name: BUSPIRONE HCL 10 MG TABLET] 90 tablet     Sig: TAKE 1 TABLET ONCE DAILY AT NIGHT.     Psychiatry: Anxiolytics/Hypnotics - Non-controlled Passed - 06/05/2019  1:31 PM      Passed - Valid encounter within last 6 months    Recent Outpatient Visits          3 weeks ago Current mild episode of major depressive disorder, unspecified whether recurrent [facility])   [facility] Danelle Berry, PA-C   2 months ago Anxiety and depression   [facility] [place], Gerome Apley, FNP   4 months ago Mixed obsessional thoughts and acts   [facility] [place], Gerome Apley, FNP   5 months ago Mixed obsessional thoughts and acts   [facility] Doren Custard, FNP   7 months ago Urticaria of unknown origin   [facility] [place], Gerome Apley, FNP      Future Appointments            In 3 months Annye Asa, Gerome Apley, FNP [facility], [facility]

## 2019-06-20 ENCOUNTER — Other Ambulatory Visit: Payer: Self-pay | Admitting: Family Medicine

## 2019-06-20 DIAGNOSIS — L509 Urticaria, unspecified: Secondary | ICD-10-CM

## 2019-06-20 NOTE — Telephone Encounter (Signed)
Requested Prescriptions  Pending Prescriptions Disp Refills  . hydrOXYzine (VISTARIL) 50 MG capsule [Pharmacy Med Name: HYDROXYZINE PAM 50 MG CAP] 540 capsule 1    Sig: TAKE 1 TO 2 CAPSULES BY MOUTH 3 TIMES A DAY AS NEEDED FOR SLEEP/ANXIETY     Ear, Nose, and Throat:  Antihistamines Passed - 06/20/2019  7:35 PM      Passed - Valid encounter within last 12 months    Recent Outpatient Visits          1 month ago Current mild episode of major depressive disorder, unspecified whether recurrent [facility])   [facility] Danelle Berry, PA-C   3 months ago Anxiety and depression   [facility] [place], Gerome Apley, FNP   4 months ago Mixed obsessional thoughts and acts   [facility] [place], Gerome Apley, FNP   5 months ago Mixed obsessional thoughts and acts   [facility] [place], Gerome Apley, FNP   8 months ago Urticaria of unknown origin   [facility] [place], Gerome Apley, FNP      Future Appointments            In 2 months Annye Asa, Gerome Apley, FNP [facility], [facility]

## 2019-07-26 NOTE — Telephone Encounter (Signed)
Nothing the following week available, however we can do the week after if that's ok.

## 2019-09-07 ENCOUNTER — Telehealth (INDEPENDENT_AMBULATORY_CARE_PROVIDER_SITE_OTHER): Payer: BC Managed Care – PPO | Admitting: Family Medicine

## 2019-09-07 ENCOUNTER — Encounter: Payer: Self-pay | Admitting: Family Medicine

## 2019-09-07 ENCOUNTER — Other Ambulatory Visit: Payer: Self-pay

## 2019-09-07 VITALS — Ht 60.0 in | Wt 157.3 lb

## 2019-09-07 DIAGNOSIS — J069 Acute upper respiratory infection, unspecified: Secondary | ICD-10-CM

## 2019-09-07 MED ORDER — AMOXICILLIN-POT CLAVULANATE 875-125 MG PO TABS: 1.0000 | ORAL_TABLET | Freq: Two times a day (BID) | ORAL | 0 refills | Status: AC

## 2019-09-07 NOTE — Progress Notes (Signed)
Name: Sandra Munoz   MRN: [id number]    DOB: 01/08/1981   Date:09/07/2019       Progress Note  Subjective:    Chief Complaint  Chief Complaint  Patient presents with  . Sore Throat    hot flash, fatigue and headache onset 1 week no fever    I connected with  Sandra Munoz  on 09/07/19 at  8:40 AM EDT by a video enabled telemedicine application and verified that I am speaking with the correct person using two identifiers.  I discussed the limitations of evaluation and management by telemedicine and the availability of in person appointments. The patient expressed understanding and agreed to proceed. Staff also discussed with the patient that there may be a patient responsible charge related to this service. Patient Location: home Provider Location: [facility] Additional Individuals present:  None  Virtual encounter video would not connect through epic/mychart today, telephone visit done instead  Pt developed clear profuse nasal drainage, scratchy throat feels like something is stuck or hung up in her throat, she endorses some mild sinus pressure - "tapping" on maxillary sinuses to "try and knock something loose" mild malaise and fatigue.  No sick contacts.  She is fully vaccinated for COVID. Onset of sx 7 days ago, she has gradual worsening, has not been able to exercise, but doesn't feel feverish, severe fatigue, no exertional SX.   No meds tried at all, no cold/cough meds, no tylenol or ibuprofen She has rested a little No improvement  URI  This is a new problem. The current episode started in the past 7 days. The problem has been gradually worsening. There has been no fever. Associated symptoms include congestion, headaches, rhinorrhea, sinus pain (sensitive to max sinuses, forehead is fine), sneezing, a sore throat (feels like a chunk in her throat) and swollen glands. Pertinent negatives include no abdominal pain, chest pain, coughing (clearing throat, no chest congestion or cough),  diarrhea, dysuria, ear pain, joint pain, joint swelling, nausea, neck pain, plugged ear sensation, rash, vomiting or wheezing. She has tried antihistamine (take daily allergy pill) for the symptoms. The treatment provided no relief.       Patient Active Problem List   Diagnosis Date Noted  . History of OCD (obsessive compulsive disorder) 08/30/2018  . Anxiety and depression 08/30/2018  . Hx of cold sores 08/30/2018  . Urticaria of unknown origin 02/13/2013    Social History   Tobacco Use  . Smoking status: Former Smoker    Types: Cigarettes    Quit date: 09/30/2009    Years since quitting: 9.9  . Smokeless tobacco: Never Used  Substance Use Topics  . Alcohol use: Yes    Comment: ocasional     Current Outpatient Medications:  .  busPIRone (BUSPAR) 10 MG tablet, TAKE 1 TABLET ONCE DAILY AT NIGHT., Disp: 90 tablet, Rfl: 1 .  cyanocobalamin (,VITAMIN B-12,) 1000 MCG/ML injection, INJECT 1 ML INTO THE SKIN INTRAMUSCULARLY ONCE EVERY MONTH, Disp: 1 mL, Rfl: 3 .  escitalopram (LEXAPRO) 10 MG tablet, Take 1.5 tablets (15 mg total) by mouth daily., Disp: 135 tablet, Rfl: 3 .  hydrOXYzine (VISTARIL) 50 MG capsule, TAKE 1 TO 2 CAPSULES BY MOUTH 3 TIMES A DAY AS NEEDED FOR SLEEP/ANXIETY, Disp: 540 capsule, Rfl: 1 .  levocetirizine (XYZAL) 5 MG tablet, TAKE 1 TABLET BY MOUTH IN THE MORNING, Disp: 90 tablet, Rfl: 1 .  valACYclovir (VALTREX) 500 MG tablet, Take 1000mg  on day 1 of outbreak, then take 500mg  once  daily for 3 days., Disp: 90 tablet, Rfl: 1  Allergies  Allergen Reactions  . Corn-Containing Products Hives  . Milk Protein Hives    I personally reviewed active problem list, medication list, allergies, family history, social history, health maintenance, notes from last encounter, lab results, imaging with the patient/caregiver today.   Review of Systems  Constitutional: Negative.  Negative for appetite change, chills, diaphoresis, fever and unexpected weight change.  HENT:  Positive for congestion, postnasal drip, rhinorrhea, sinus pressure, sinus pain (sensitive to max sinuses, forehead is fine), sneezing, sore throat (feels like a chunk in her throat) and voice change (scratchy). Negative for dental problem, drooling, ear discharge, ear pain, facial swelling, tinnitus and trouble swallowing.   Eyes: Positive for photophobia (some mild photophobia with mild HA). Negative for discharge and itching.  Respiratory: Negative.  Negative for cough (clearing throat, no chest congestion or cough), choking, shortness of breath, wheezing and stridor.   Cardiovascular: Negative.  Negative for chest pain, palpitations and leg swelling.  Gastrointestinal: Negative.  Negative for abdominal distention, abdominal pain, diarrhea, nausea and vomiting.  Endocrine: Negative.   Genitourinary: Negative.  Negative for dysuria.  Musculoskeletal: Negative.  Negative for joint pain and neck pain.  Skin: Negative.  Negative for rash.  Allergic/Immunologic: Negative.   Neurological: Positive for headaches.  Hematological: Negative.   Psychiatric/Behavioral: Negative.   All other systems reviewed and are negative.     Objective:   Virtual encounter, vitals limited, only able to obtain the following Today's Vitals   09/07/19 0838  Weight: 157 lb 4.8 oz (71.4 kg)  Height: 5' (1.524 m)   Body mass index is 30.72 kg/m. Nursing Note and Vital Signs reviewed.  Physical Exam Vitals and nursing note reviewed.  HENT:     Head:     Comments: Some sniffing heard  Neck:     Comments: Phonation mostly clear, slightly scratchy Pulmonary:     Comments: No audible tachypnea, wheeze or stridor Speaking in full and complete sentences Neurological:     Mental Status: She is alert.  Psychiatric:        Mood and Affect: Mood normal.        Behavior: Behavior normal.     PE limited by telephone encounter  No results found for this or any previous visit (from the past 72  hour(s)).  Assessment and Plan:     ICD-10-CM   1. Upper respiratory tract infection, unspecified type  J06.9    7 d sx, nasal sx, scratchy throat some constitutional sx, no severe pain, fever or SOB/cough.  tx supportive/sx, wait, could start abx if worsening     Advised pt to : Continue other supportive and symptomatic treatment - push fluids, rest, take tylenol and ibuprofen as directed on bottle/box for fever and pain  Can continue over the counter cold and cough medicines, antihistamines, steroid nasal sprays, decongestants  Viral illness should gradually improve  Do not go to work until you are fever free w/o medicines for 24 hours +  If her nasal/sinus sx suddenly worsen with acute worsening of pain with fever or severe sx prolonged more than 10-12 days, then pt could start abx for acute bacterial sinusitis.  Currently she does not sound like abx are indicated right now - she still may improve with supportive and sx measures and some more time.  Augmentin sent in - discussed with pt (send in due to weekend coming up- over weekend pt will have been symptomatic for more  than 10 days) Meds ordered this encounter  Medications  . amoxicillin-clavulanate (AUGMENTIN) 875-125 MG tablet    Sig: Take 1 tablet by mouth 2 (two) times daily for 7 days.    Dispense:  14 tablet    Pt vaccinated fully - unlikely to be COVID - pt can decide for herself if she would like to get tested, she does not need a work note, she is only home with her 40 year old.      -Red flags and when to present for emergency care or RTC including chest pain, shortness of breath, new/worsening/un-resolving symptoms, reviewed with patient at time of visit. Follow up and care instructions discussed and provided in AVS. - I discussed the assessment and treatment plan with the patient. The patient was provided an opportunity to ask questions and all were answered. The patient agreed with the plan and demonstrated an  understanding of the instructions.  I provided 20+ minutes of non-face-to-face time during this encounter.  Danelle Berry, PA-C 09/07/19 8:56 AM

## 2019-09-12 ENCOUNTER — Encounter: Payer: BLUE CROSS/BLUE SHIELD | Admitting: Family Medicine

## 2019-09-20 ENCOUNTER — Other Ambulatory Visit: Payer: Self-pay

## 2019-09-20 ENCOUNTER — Ambulatory Visit (INDEPENDENT_AMBULATORY_CARE_PROVIDER_SITE_OTHER): Payer: BC Managed Care – PPO | Admitting: Family Medicine

## 2019-09-20 ENCOUNTER — Encounter: Payer: Self-pay | Admitting: Family Medicine

## 2019-09-20 VITALS — BP 118/78 | HR 76 | Temp 98.3°F | Resp 14 | Ht 60.0 in | Wt 165.4 lb

## 2019-09-20 DIAGNOSIS — F5105 Insomnia due to other mental disorder: Secondary | ICD-10-CM | POA: Insufficient documentation

## 2019-09-20 DIAGNOSIS — E8881 Metabolic syndrome: Secondary | ICD-10-CM

## 2019-09-20 DIAGNOSIS — K9589 Other complications of other bariatric procedure: Secondary | ICD-10-CM | POA: Insufficient documentation

## 2019-09-20 DIAGNOSIS — E559 Vitamin D deficiency, unspecified: Secondary | ICD-10-CM | POA: Insufficient documentation

## 2019-09-20 DIAGNOSIS — D509 Iron deficiency anemia, unspecified: Secondary | ICD-10-CM | POA: Diagnosis not present

## 2019-09-20 DIAGNOSIS — F320 Major depressive disorder, single episode, mild: Secondary | ICD-10-CM | POA: Insufficient documentation

## 2019-09-20 DIAGNOSIS — Z8619 Personal history of other infectious and parasitic diseases: Secondary | ICD-10-CM

## 2019-09-20 DIAGNOSIS — Z0000 Encounter for general adult medical examination without abnormal findings: Secondary | ICD-10-CM

## 2019-09-20 DIAGNOSIS — Z1231 Encounter for screening mammogram for malignant neoplasm of breast: Secondary | ICD-10-CM | POA: Diagnosis not present

## 2019-09-20 DIAGNOSIS — E669 Obesity, unspecified: Secondary | ICD-10-CM

## 2019-09-20 DIAGNOSIS — Z9884 Bariatric surgery status: Secondary | ICD-10-CM | POA: Insufficient documentation

## 2019-09-20 DIAGNOSIS — E785 Hyperlipidemia, unspecified: Secondary | ICD-10-CM

## 2019-09-20 DIAGNOSIS — Z6832 Body mass index (BMI) 32.0-32.9, adult: Secondary | ICD-10-CM

## 2019-09-20 DIAGNOSIS — E538 Deficiency of other specified B group vitamins: Secondary | ICD-10-CM | POA: Diagnosis not present

## 2019-09-20 DIAGNOSIS — F419 Anxiety disorder, unspecified: Secondary | ICD-10-CM

## 2019-09-20 DIAGNOSIS — Z8659 Personal history of other mental and behavioral disorders: Secondary | ICD-10-CM

## 2019-09-20 DIAGNOSIS — F99 Mental disorder, not otherwise specified: Secondary | ICD-10-CM | POA: Insufficient documentation

## 2019-09-20 DIAGNOSIS — R635 Abnormal weight gain: Secondary | ICD-10-CM

## 2019-09-20 MED ORDER — CYANOCOBALAMIN 1000 MCG/ML IJ SOLN: INTRAMUSCULAR | 11 refills | Status: DC

## 2019-09-20 MED ORDER — CYANOCOBALAMIN 1000 MCG/ML IJ SOLN
1000.0000 ug | Freq: Once | INTRAMUSCULAR | Status: AC
  Administered 2019-09-20: 1000 ug via INTRAMUSCULAR

## 2019-09-20 MED ORDER — VALACYCLOVIR HCL 500 MG PO TABS: ORAL_TABLET | ORAL | 5 refills | Status: DC

## 2019-09-20 NOTE — Patient Instructions (Signed)

## 2019-09-20 NOTE — Progress Notes (Signed)
Patient: Sandra Munoz, Female    DOB: 12-03-1980, 39 y.o.   MRN: [id number] Delsa Grana, PA-C Visit Date: 09/20/2019  Today's Provider: Delsa Grana, PA-C   Chief Complaint  Patient presents with  . Annual Exam    CPE   Subjective:   Annual physical exam:  Sandra Munoz is a 39 y.o. female who presents today for complete physical exam:  Exercise/Activity:  3-5 days a week, runs several miles several times a week. Diet/nutrition:  Doesn't eat any processed foods or box, a lot of vegetables, a lot of high fiber, fish, chicken, avoids dairy Sleep:  Never been a good sleeper  [place] She feels like she has gradually gained weight since then She tracks calories 1100-1300 per day, she has to eat small amounts, gradual weight gain over the past 2 years 30-35 lbs Hx of PCOS and insulin resistance   Has multiple chronic conditions not on med list, last labs from one year ago - iron deficiency, b12 deficiency, anemia, HLD She is s/p gastric bypass - with noted deficiencies likely due to malabsorption secondary to surgery  She has hx of anxiety and depression, currently well controlled with lexapro 15 mg daily and vistaril prn/buspar prn Typically uses buspar regularly in the evening, she rarely uses hydroxyzine  Valtrex prn for herpes outbreaks   USPSTF grade A and B recommendations - reviewed and addressed today  Depression:  Phq 9 completed today by patient, was reviewed by me with patient in the room PHQ score is neg, pt feels overall good, is managing her anxiety, depression and insomnia PHQ 2/9 Scores 09/20/2019 09/07/2019 05/09/2019 03/09/2019  PHQ - 2 Score 0 0 1 2  PHQ- 9 Score 1 0 1 12   Depression screen [facility] 2/9 09/20/2019 09/07/2019 05/09/2019 03/09/2019 01/31/2019  Decreased Interest 0 0 0 1 1  Down, Depressed, Hopeless 0 0 1 1 0  PHQ - 2 Score 0 0 '1 2 1  ' Altered sleeping 1 0 0 3 2  Tired, decreased energy 0 0 0 1 0  Change in appetite 0 0 0 1  0  Feeling bad or failure about yourself  0 0 0 1 0  Trouble concentrating 0 0 0 3 2  Moving slowly or fidgety/restless 0 0 0 1 0  Suicidal thoughts 0 0 0 0 0  PHQ-9 Score 1 0 '1 12 5  ' Difficult doing work/chores Not difficult at all Not difficult at all Not difficult at all Somewhat difficult Not difficult at all  Some recent data might be hidden    Alcohol screening:   [facility] from 09/20/2019 in [facility]  AUDIT-C Score 0      Immunizations and Health Maintenance: Health Maintenance  Topic Date Due  . TETANUS/TDAP  05/08/2020 (Originally 07/26/1998)  . INFLUENZA VACCINE  10/01/2019  . COVID-19 Vaccine  Completed  . Hepatitis C Screening  Completed  . HIV Screening  Completed     Hep C Screening: done  STD testing and prevention (HIV/chl/gon/syphilis):  see above, no additional testing desired by pt today  Intimate partner violence: safe  Sexual History/Pain during Intercourse: Married  Menstrual History/LMP/Abnormal Bleeding: no bleeding or concerns No LMP recorded. Patient has had a hysterectomy.  Incontinence Symptoms: none  Breast cancer: due - ordered Last Mammogram: *see HM list above BRCA gene screening: none  Cervical cancer screening: not indicated  Pt denies family hx of cancers - breast, ovarian, uterine, colon  Grandmother? Uterine  Osteoporosis:   Discussion on osteoporosis per age, including high calcium and vitamin D supplementation, weight bearing exercises  Skin cancer:  Hx of skin CA -  N Mother had SCC Discussed atypical lesions   Colorectal cancer:    Colonoscopy is not indicated yet  Discussed concerning signs and sx of CRC, pt denies melena, hematochezia, change in BM  Lung cancer:   Low Dose CT Chest recommended if Age 64-80 years, 30 pack-year currently smoking OR have quit w/in 15years. Patient does not qualify.    Social History   Tobacco Use  . Smoking status: Former Smoker    Types: Cigarettes     Quit date: 09/30/2009    Years since quitting: 9.9  . Smokeless tobacco: Never Used  Vaping Use  . Vaping Use: Never used  Substance Use Topics  . Alcohol use: Yes    Comment: ocasional  . Drug use: Never       [facility] Visit from 09/20/2019 in [facility]  AUDIT-C Score 0      Family History  Problem Relation Age of Onset  . Cancer Mother   . Autoimmune disease Mother      Blood pressure/Hypertension: BP Readings from Last 3 Encounters:  09/20/19 118/78  09/09/18 120/70  08/30/18 112/70    Weight/Obesity: Wt Readings from Last 3 Encounters:  09/20/19 165 lb 6.4 oz (75 kg)  09/07/19 157 lb 4.8 oz (71.4 kg)  05/09/19 152 lb 4.8 oz (69.1 kg)   BMI Readings from Last 3 Encounters:  09/20/19 32.30 kg/m  09/07/19 30.72 kg/m  05/09/19 30.76 kg/m     Lipids:  Lab Results  Component Value Date   CHOL 195 09/09/2018   Lab Results  Component Value Date   HDL 55 09/09/2018   Lab Results  Component Value Date   LDLCALC 124 (H) 09/09/2018   Lab Results  Component Value Date   TRIG 69 09/09/2018   Lab Results  Component Value Date   CHOLHDL 3.5 09/09/2018   No results found for: LDLDIRECT Based on the results of lipid panel his/her cardiovascular risk factor ( using [place] )  in the next 10 years is: The 10-year ASCVD risk score Mikey Bussing DC Brooke Bonito., et al., 2013) is: 0.5%   Values used to calculate the score:     Age: 23 years     Sex: Female     Is Non-Hispanic African American: No     Diabetic: No     Tobacco smoker: No     Systolic Blood Pressure: 440 mmHg     Is BP treated: No     HDL Cholesterol: 55 mg/dL     Total Cholesterol: 195 mg/dL  Glucose:  Glucose, Bld  Date Value Ref Range Status  09/09/2018 78 65 - 99 mg/dL Final    Comment:    .            Fasting reference interval .   08/22/2018 127 (H) 70 - 99 mg/dL Final   Hypertension: BP Readings from Last 3 Encounters:  09/20/19 118/78  09/09/18 120/70  08/30/18  112/70   Obesity: Wt Readings from Last 3 Encounters:  09/20/19 165 lb 6.4 oz (75 kg)  09/07/19 157 lb 4.8 oz (71.4 kg)  05/09/19 152 lb 4.8 oz (69.1 kg)   BMI Readings from Last 3 Encounters:  09/20/19 32.30 kg/m  09/07/19 30.72 kg/m  05/09/19 30.76 kg/m     Advanced Care Planning:  A  voluntary discussion about advance care planning including the explanation and discussion of advance directives.      Social History      She        Social History   Socioeconomic History  . Marital status: Married    Spouse name: Harrell Gave  . Number of children: 1  . Years of education: Not on file  . Highest education level: Not on file  Occupational History  . Not on file  Tobacco Use  . Smoking status: Former Smoker    Types: Cigarettes    Quit date: 09/30/2009    Years since quitting: 9.9  . Smokeless tobacco: Never Used  Vaping Use  . Vaping Use: Never used  Substance and Sexual Activity  . Alcohol use: Yes    Comment: ocasional  . Drug use: Never  . Sexual activity: Yes    Birth control/protection: Surgical  Other Topics Concern  . Not on file  Social History Narrative  . Not on file   Social Determinants of Health   Financial Resource Strain:   . Difficulty of Paying Living Expenses:   Food Insecurity:   . Worried About Charity fundraiser in the Last Year:   . Arboriculturist in the Last Year:   Transportation Needs:   . Film/video editor (Medical):   Marland Kitchen Lack of Transportation (Non-Medical):   Physical Activity:   . Days of Exercise per Week:   . Minutes of Exercise per Session:   Stress:   . Feeling of Stress :   Social Connections:   . Frequency of Communication with Friends and Family:   . Frequency of Social Gatherings with Friends and Family:   . Attends Religious Services:   . Active Member of Clubs or Organizations:   . Attends Archivist Meetings:   Marland Kitchen Marital Status:     Family History        Family History  Problem Relation  Age of Onset  . Cancer Mother   . Autoimmune disease Mother     Patient Active Problem List   Diagnosis Date Noted  . Vitamin D deficiency 09/20/2019  . Class 1 obesity with body mass index (BMI) of 32.0 to 32.9 in adult 09/20/2019  . Hyperlipidemia 09/20/2019  . Current mild episode of major depressive disorder ([place]) 09/20/2019  . Insomnia due to other mental disorder 09/20/2019  . S/P gastric bypass 09/20/2019  . B12 deficiency 09/20/2019  . Iron deficiency anemia 09/20/2019  . History of OCD (obsessive compulsive disorder) 08/30/2018  . Anxiety disorder 08/30/2018  . Hx of cold sores 08/30/2018  . Urticaria of unknown origin 02/13/2013    Past Surgical History:  Procedure Laterality Date  . ABDOMINAL HYSTERECTOMY    . GASTRIC BYPASS       Current Outpatient Medications:  .  busPIRone (BUSPAR) 10 MG tablet, TAKE 1 TABLET ONCE DAILY AT NIGHT., Disp: 90 tablet, Rfl: 1 .  cyanocobalamin (,VITAMIN B-12,) 1000 MCG/ML injection, INJECT 1 ML INTO THE SKIN INTRAMUSCULARLY ONCE EVERY MONTH, Disp: 1 mL, Rfl: 3 .  escitalopram (LEXAPRO) 10 MG tablet, Take 1.5 tablets (15 mg total) by mouth daily., Disp: 135 tablet, Rfl: 3 .  hydrOXYzine (VISTARIL) 50 MG capsule, TAKE 1 TO 2 CAPSULES BY MOUTH 3 TIMES A DAY AS NEEDED FOR SLEEP/ANXIETY, Disp: 540 capsule, Rfl: 1 .  levocetirizine (XYZAL) 5 MG tablet, TAKE 1 TABLET BY MOUTH IN THE MORNING, Disp: 90 tablet, Rfl: 1 .  valACYclovir (  VALTREX) 500 MG tablet, Take 1075m on day 1 of outbreak, then take 504monce daily for 3 days., Disp: 90 tablet, Rfl: 1  Allergies  Allergen Reactions  . Corn-Containing Products Hives  . Milk Protein Hives    Patient Care Team: TaDelsa GranaPA-C as PCP - General (Family Medicine)  Review of Systems  Constitutional: Positive for fatigue and unexpected weight change. Negative for activity change and appetite change.  HENT: Negative.   Eyes: Negative.   Respiratory: Negative.  Negative for shortness of  breath.   Cardiovascular: Negative.  Negative for chest pain, palpitations and leg swelling.  Gastrointestinal: Negative.  Negative for abdominal pain and blood in stool.  Endocrine: Negative.   Genitourinary: Negative.   Musculoskeletal: Negative.  Negative for arthralgias, gait problem, joint swelling and myalgias.  Skin: Negative.  Negative for color change, pallor and rash.  Allergic/Immunologic: Negative.   Neurological: Negative.  Negative for syncope and weakness.  Hematological: Negative.   Psychiatric/Behavioral: Negative.  Negative for confusion, dysphoric mood, self-injury and suicidal ideas. The patient is not nervous/anxious.      I personally reviewed active problem list, medication list, allergies, family history, social history, health maintenance, notes from last encounter, lab results, imaging with the patient/caregiver today.        Objective:   Vitals:  Vitals:   09/20/19 1416  BP: 118/78  Pulse: 76  Resp: 14  Temp: 98.3 F (36.8 C)  TempSrc: Temporal  SpO2: 100%  Weight: 165 lb 6.4 oz (75 kg)  Height: 5' (1.524 m)    Body mass index is 32.3 kg/m.  Physical Exam Vitals and nursing note reviewed.  Constitutional:      General: She is not in acute distress.    Appearance: Normal appearance. She is well-developed. She is not ill-appearing, toxic-appearing or diaphoretic.     Interventions: Face mask in place.  HENT:     Head: Normocephalic and atraumatic.     Right Ear: External ear normal.     Left Ear: External ear normal.  Eyes:     General: Lids are normal. No scleral icterus.       Right eye: No discharge.        Left eye: No discharge.     Conjunctiva/sclera: Conjunctivae normal.  Neck:     Trachea: Phonation normal. No tracheal deviation.  Cardiovascular:     Rate and Rhythm: Normal rate and regular rhythm.     Pulses: Normal pulses.          Radial pulses are 2+ on the right side and 2+ on the left side.       Posterior tibial pulses  are 2+ on the right side and 2+ on the left side.     Heart sounds: Normal heart sounds. No murmur heard.  No friction rub. No gallop.   Pulmonary:     Effort: Pulmonary effort is normal. No respiratory distress.     Breath sounds: Normal breath sounds. No stridor. No wheezing, rhonchi or rales.  Chest:     Chest wall: No tenderness.     Breasts:        Right: Normal. No swelling, bleeding, inverted nipple, mass, nipple discharge, skin change or tenderness.        Left: Normal. No swelling, bleeding, inverted nipple, mass, nipple discharge, skin change or tenderness.  Abdominal:     General: Bowel sounds are normal. There is no distension.     Palpations: Abdomen is soft.  Tenderness: There is no abdominal tenderness. There is no guarding or rebound.  Musculoskeletal:        General: No deformity. Normal range of motion.     Cervical back: Normal range of motion and neck supple.     Right lower leg: No edema.     Left lower leg: No edema.  Lymphadenopathy:     Cervical: No cervical adenopathy.     Upper Body:     Right upper body: No supraclavicular, axillary or pectoral adenopathy.     Left upper body: No supraclavicular, axillary or pectoral adenopathy.  Skin:    General: Skin is warm and dry.     Capillary Refill: Capillary refill takes less than 2 seconds.     Coloration: Skin is not jaundiced or pale.     Findings: No rash.  Neurological:     Mental Status: She is alert and oriented to person, place, and time.     Motor: No abnormal muscle tone.     Gait: Gait normal.  Psychiatric:        Speech: Speech normal.        Behavior: Behavior normal.       Fall Risk: Fall Risk  09/20/2019 09/07/2019 05/09/2019 03/09/2019 01/31/2019  Falls in the past year? 0 0 0 0 0  Number falls in past yr: 0 0 0 0 0  Injury with Fall? 0 0 0 0 0  Follow up - - - Falls evaluation completed Falls evaluation completed    Functional Status Survey: Is the patient deaf or have difficulty  hearing?: No Does the patient have difficulty seeing, even when wearing glasses/contacts?: No Does the patient have difficulty concentrating, remembering, or making decisions?: No Does the patient have difficulty walking or climbing stairs?: No Does the patient have difficulty dressing or bathing?: No Does the patient have difficulty doing errands alone such as visiting a doctor's office or shopping?: No   Assessment & Plan:    CPE completed today  . USPSTF grade A and B recommendations reviewed with patient; age-appropriate recommendations, preventive care, screening tests, etc discussed and encouraged; healthy living encouraged; see AVS for patient education given to patient  . Discussed importance of 150 minutes of physical activity weekly, AHA exercise recommendations given to pt in AVS/handout  . Discussed importance of healthy diet:  eating lean meats and proteins, avoiding trans fats and saturated fats, avoid simple sugars and excessive carbs in diet, eat 6 servings of fruit/vegetables daily and drink plenty of water and avoid sweet beverages.    . Recommended pt to do annual eye exam and routine dental exams/cleanings  . Depression, alcohol, fall screening completed as documented above and per flowsheets  . Reviewed Health Maintenance: Health Maintenance  Topic Date Due  . MAMMOGRAM  Never done  . TETANUS/TDAP  05/08/2020 (Originally 07/26/1998)  . INFLUENZA VACCINE  10/01/2019  . COVID-19 Vaccine  Completed  . Hepatitis C Screening  Completed  . HIV Screening  Completed    . Immunizations: Immunization History  Administered Date(s) Administered  . Influenza-Unspecified 11/22/2018  . PFIZER SARS-COV-2 Vaccination 05/14/2019, 06/04/2019   1. Adult general medical exam - CBC with Differential/Platelet - COMPLETE METABOLIC PANEL WITH GFR - Lipid panel  2. Encounter for screening mammogram for malignant neoplasm of breast - MM 3D SCREEN BREAST BILATERAL; Future  3.  B12 deficiency Injection today - cyanocobalamin (,VITAMIN B-12,) 1000 MCG/ML injection; INJECT 1 ML INTO THE SKIN INTRAMUSCULARLY ONCE EVERY MONTH  Dispense: 1  mL; Refill: 11 - CBC with Differential/Platelet - Vitamin B12  4. S/P gastric bypass Want to est with local specialist for further management of weight and of nutritional deficiencies  5. History of OCD (obsessive compulsive disorder) Currently well controlled  6. Insomnia due to other mental disorder Uses hydroxyzine prn for sleep  7. Anxiety disorder, unspecified type Sx currently well controlled with lexapro and buspar  8. Current mild episode of major depressive disorder, unspecified whether recurrent ([place]) phq reviewed, well controlled with lexapro and buspar  9. Iron deficiency anemia, unspecified iron deficiency anemia type - CBC with Differential/Platelet - Iron, TIBC and Ferritin Panel  10. Vitamin D deficiency Supplementing chews - absorbs better per pt  11. Class 1 obesity with body mass index (BMI) of 32.0 to 32.9 in adult, unspecified obesity type, unspecified whether serious comorbidity present She is concerned with weight gain over the past 1-2 years following hysterectomy - wants to f/up with bariatric/weight specialists - she wasnts hormonal testing done to see if it has to do with hysterectomy Hx of PCOS   12. Hyperlipidemia, unspecified hyperlipidemia type Checking lipid panel and CMP - eating very little  13. Weight gain  30-35 lbs gained over the past year  14. Insulin resistance With hx of PCOS No hirsutism, s/p hysterectomy, with weight gain she is concerned about metabolism/hormones Offered insulin lab - other hormones would need to be per OBGYN specialists - she will go to weight/bariatric specialists - Insulin, random    ICD-10-CM   1. Adult general medical exam  Z00.00 CBC with Differential/Platelet    COMPLETE METABOLIC PANEL WITH GFR    Lipid panel  2. Encounter for screening  mammogram for malignant neoplasm of breast  Z12.31 MM 3D SCREEN BREAST BILATERAL  3. B12 deficiency  E53.8 cyanocobalamin (,VITAMIN B-12,) 1000 MCG/ML injection    CBC with Differential/Platelet    Vitamin B12    Amb Referral to Bariatric Surgery    cyanocobalamin ((VITAMIN B-12)) injection 1,000 mcg  4. S/P gastric bypass  Z98.84 Amb Referral to Bariatric Surgery  5. History of OCD (obsessive compulsive disorder)  Z86.59   6. Insomnia due to other mental disorder  F51.05    F99   7. Anxiety disorder, unspecified type  F41.9   8. Current mild episode of major depressive disorder, unspecified whether recurrent ([place])  F32.0   9. Iron deficiency anemia, unspecified iron deficiency anemia type  D50.9 CBC with Differential/Platelet    Iron, TIBC and Ferritin Panel    Amb Referral to Bariatric Surgery  10. Vitamin D deficiency  E55.9 Amb Referral to Bariatric Surgery  11. Class 1 obesity with body mass index (BMI) of 32.0 to 32.9 in adult, unspecified obesity type, unspecified whether serious comorbidity present  E66.9 Amb Referral to Bariatric Surgery   Z68.32 TSH  12. Hyperlipidemia, unspecified hyperlipidemia type  E78.5   13. Weight gain  R63.5 TSH    Amb Referral to Bariatric Surgery    TSH  14. Insulin resistance  E88.81 Insulin, random    Amb Referral to Bariatric Surgery    TSH  15. Hx of cold sores  Z86.19 valACYclovir (VALTREX) 500 MG tablet  meds refilled per #15    Delsa Grana, PA-C 09/20/19 2:31 PM  [facility]

## 2019-09-21 LAB — COMPLETE METABOLIC PANEL WITH GFR
AG Ratio: 1.6 (calc) (ref 1.0–2.5)
ALT: 13 U/L (ref 6–29)
AST: 19 U/L (ref 10–30)
Albumin: 4.1 g/dL (ref 3.6–5.1)
Alkaline phosphatase (APISO): 54 U/L (ref 31–125)
BUN: 10 mg/dL (ref 7–25)
CO2: 26 mmol/L (ref 20–32)
Calcium: 8.6 mg/dL (ref 8.6–10.2)
Chloride: 108 mmol/L (ref 98–110)
Creat: 0.67 mg/dL (ref 0.50–1.10)
GFR, Est African American: 127 mL/min/{1.73_m2} (ref >=60)
GFR, Est Non African American: 110 mL/min/{1.73_m2} (ref >=60)
Globulin: 2.5 g/dL (calc) (ref 1.9–3.7)
Glucose, Bld: 75 mg/dL (ref 65–99)
Potassium: 4.1 mmol/L (ref 3.5–5.3)
Sodium: 138 mmol/L (ref 135–146)
Total Bilirubin: 0.2 mg/dL (ref 0.2–1.2)
Total Protein: 6.6 g/dL (ref 6.1–8.1)

## 2019-09-21 LAB — CBC WITH DIFFERENTIAL/PLATELET
Absolute Monocytes: 381 cells/uL (ref 200–950)
Basophils Absolute: 28 cells/uL (ref 0–200)
Basophils Relative: 0.5 %
Eosinophils Absolute: 112 cells/uL (ref 15–500)
Eosinophils Relative: 2 %
HCT: 30.1 % — ABNORMAL LOW (ref 35.0–45.0)
Hemoglobin: 8.6 g/dL — ABNORMAL LOW (ref 11.7–15.5)
Lymphs Abs: 2968 cells/uL (ref 850–3900)
MCH: 20.7 pg — ABNORMAL LOW (ref 27.0–33.0)
MCHC: 28.6 g/dL — ABNORMAL LOW (ref 32.0–36.0)
MCV: 72.4 fL — ABNORMAL LOW (ref 80.0–100.0)
MPV: 10.3 fL (ref 7.5–12.5)
Monocytes Relative: 6.8 %
Neutro Abs: 2111 cells/uL (ref 1500–7800)
Neutrophils Relative %: 37.7 %
Platelets: 343 10*3/uL (ref 140–400)
RBC: 4.16 10*6/uL (ref 3.80–5.10)
RDW: 17 % — ABNORMAL HIGH (ref 11.0–15.0)
Total Lymphocyte: 53 %
WBC: 5.6 10*3/uL (ref 3.8–10.8)

## 2019-09-21 LAB — LIPID PANEL
Cholesterol: 176 mg/dL (ref <200)
HDL: 50 mg/dL (ref >=50)
LDL Cholesterol (Calc): 111 mg/dL (calc) — ABNORMAL HIGH
Non-HDL Cholesterol (Calc): 126 mg/dL (calc) (ref <130)
Total CHOL/HDL Ratio: 3.5 (calc) (ref <5.0)
Triglycerides: 61 mg/dL (ref <150)

## 2019-09-21 LAB — IRON,TIBC AND FERRITIN PANEL
Ferritin: 3 ng/mL — ABNORMAL LOW (ref 16–154)
Iron: 11 ug/dL — ABNORMAL LOW (ref 40–190)

## 2019-09-21 LAB — TSH: TSH: 1.08 mIU/L

## 2019-09-21 LAB — INSULIN, RANDOM: Insulin: 2.8 u[IU]/mL

## 2019-09-21 LAB — VITAMIN B12: Vitamin B-12: >2000 pg/mL — ABNORMAL HIGH (ref 200–1100)

## 2019-09-25 ENCOUNTER — Telehealth: Payer: Self-pay

## 2019-09-25 DIAGNOSIS — D509 Iron deficiency anemia, unspecified: Secondary | ICD-10-CM

## 2019-09-25 NOTE — Telephone Encounter (Signed)
-----   Message from Danelle Berry, [place] sent at 09/21/2019  4:30 PM EDT ----- Please notify pt of labwork  Pt continues to be anemic with very low iron - I suggest seeing hematology to help address this - even if she is still going to see bariatric/weight specialists.   Her B12 is high - she can back off on her B12 supplement a little - can do every other month injection or do 0.5 mL (if it allows her to do lower dose injection)  Vit D low - she should increase the frequency of oral supplement to have more changes to absorb it.    Insulin was low and normal Cholesterol was a little better Thyroid was normal And all other labs looked good (kidney/liver/electrolytes/blood sugar)

## 2019-09-26 ENCOUNTER — Encounter: Payer: BLUE CROSS/BLUE SHIELD | Admitting: Family Medicine

## 2019-10-06 ENCOUNTER — Inpatient Hospital Stay: Payer: BC Managed Care – PPO

## 2019-10-06 ENCOUNTER — Other Ambulatory Visit: Payer: Self-pay

## 2019-10-06 ENCOUNTER — Inpatient Hospital Stay: Payer: BC Managed Care – PPO | Attending: Internal Medicine | Admitting: Internal Medicine

## 2019-10-06 ENCOUNTER — Encounter: Payer: Self-pay | Admitting: Internal Medicine

## 2019-10-06 DIAGNOSIS — D508 Other iron deficiency anemias: Secondary | ICD-10-CM

## 2019-10-06 DIAGNOSIS — F329 Major depressive disorder, single episode, unspecified: Secondary | ICD-10-CM | POA: Insufficient documentation

## 2019-10-06 DIAGNOSIS — Z87891 Personal history of nicotine dependence: Secondary | ICD-10-CM | POA: Diagnosis not present

## 2019-10-06 DIAGNOSIS — E538 Deficiency of other specified B group vitamins: Secondary | ICD-10-CM | POA: Diagnosis not present

## 2019-10-06 DIAGNOSIS — Z90710 Acquired absence of both cervix and uterus: Secondary | ICD-10-CM | POA: Diagnosis not present

## 2019-10-06 DIAGNOSIS — D509 Iron deficiency anemia, unspecified: Secondary | ICD-10-CM | POA: Insufficient documentation

## 2019-10-06 DIAGNOSIS — K9589 Other complications of other bariatric procedure: Secondary | ICD-10-CM

## 2019-10-06 DIAGNOSIS — Z9884 Bariatric surgery status: Secondary | ICD-10-CM | POA: Insufficient documentation

## 2019-10-06 DIAGNOSIS — Z79899 Other long term (current) drug therapy: Secondary | ICD-10-CM | POA: Diagnosis not present

## 2019-10-06 NOTE — Progress Notes (Signed)
[facility] CONSULT NOTE  Patient Care Team: Danelle Berry, PA-C as PCP - General (Family Medicine)  CHIEF COMPLAINTS/PURPOSE OF CONSULTATION:    HEMATOLOGY HISTORY:   # ANEMIA [sep 2016- gastric bypass; [place]; > 100 pounds]   # TAH [fibroid; Ovaries intact- 2019]; EGD/colonoscopy- 2016/ at gastric by ass  HISTORY OF PRESENTING ILLNESS:  Sandra Munoz 39 y.o.  female has been referred to [place] for further evaluation/work-up for anemia.   Patient complains of extreme fatigue.  She also complains of significant shortness of breath with exertion/exercise.  Pica ice.  Blood in stools: None Change in bowel habits- None Blood in urine: None Difficulty swallowing: None Abnormal weight loss: None Iron supplementation: Iron pills Prior Blood transfusions: None Vaginal bleeding: None [TAH]  Review of Systems  Constitutional: Positive for malaise/fatigue. Negative for chills, diaphoresis, fever and weight loss.  HENT: Negative for nosebleeds and sore throat.   Eyes: Negative for double vision.  Respiratory: Positive for shortness of breath. Negative for cough, hemoptysis, sputum production and wheezing.   Cardiovascular: Negative for chest pain, palpitations, orthopnea and leg swelling.  Gastrointestinal: Negative for abdominal pain, blood in stool, constipation, diarrhea, heartburn, melena, nausea and vomiting.  Genitourinary: Negative for dysuria, frequency and urgency.  Musculoskeletal: Negative for back pain and joint pain.  Skin: Negative.  Negative for itching and rash.  Neurological: Negative for dizziness, tingling, focal weakness, weakness and headaches.  Endo/Heme/Allergies: Does not bruise/bleed easily.  Psychiatric/Behavioral: Negative for depression. The patient is not nervous/anxious and does not have insomnia.     MEDICAL HISTORY:  Past Medical History:  Diagnosis Date  . Allergy   . Depression   . Polycystic ovarian syndrome     SURGICAL  HISTORY: Past Surgical History:  Procedure Laterality Date  . ABDOMINAL HYSTERECTOMY    . GASTRIC BYPASS      SOCIAL HISTORY: Social History   Socioeconomic History  . Marital status: Married    Spouse name: Sandra Munoz  . Number of children: 1  . Years of education: Not on file  . Highest education level: Not on file  Occupational History  . Not on file  Tobacco Use  . Smoking status: Former Smoker    Types: Cigarettes    Quit date: 09/30/2009    Years since quitting: 10.0  . Smokeless tobacco: Never Used  Vaping Use  . Vaping Use: Never used  Substance and Sexual Activity  . Alcohol use: Yes    Comment: ocasional  . Drug use: Never  . Sexual activity: Yes    Birth control/protection: Surgical  Other Topics Concern  . 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    Social Determinants of Health   Financial Resource Strain:   . Difficulty of Paying Living Expenses:   Food Insecurity:   . Worried About Programme researcher, broadcasting/film/video in the Last Year:   . Barista in the Last Year:   Transportation Needs:   . Freight forwarder (Medical):   Marland Kitchen Lack of Transportation (Non-Medical):   Physical Activity:   . Days of Exercise per Week:   . Minutes of Exercise per Session:   Stress:   . Feeling of Stress :   Social Connections:   . Frequency of Communication with Friends and Family:   . Frequency of Social Gatherings with Friends and Family:   . Attends Religious Services:   . Active  Member of Clubs or Organizations:   . Attends Banker Meetings:   Marland Kitchen Marital Status:   Intimate Partner Violence:   . Fear of Current or Ex-Partner:   . Emotionally Abused:   Marland Kitchen Physically Abused:   . Sexually Abused:     FAMILY HISTORY: Family History  Problem Relation Age of Onset  . Cancer Mother        SCC of skin/Involved throat  . Autoimmune disease Mother     ALLERGIES:  is  allergic to corn-containing products and milk protein.  MEDICATIONS:  Current Outpatient Medications  Medication Sig Dispense Refill  . busPIRone (BUSPAR) 10 MG tablet TAKE 1 TABLET ONCE DAILY AT NIGHT. 90 tablet 1  . cyanocobalamin (,VITAMIN B-12,) 1000 MCG/ML injection INJECT 1 ML INTO THE SKIN INTRAMUSCULARLY ONCE EVERY MONTH 1 mL 11  . escitalopram (LEXAPRO) 10 MG tablet Take 1.5 tablets (15 mg total) by mouth daily. 135 tablet 3  . hydrOXYzine (VISTARIL) 50 MG capsule TAKE 1 TO 2 CAPSULES BY MOUTH 3 TIMES A DAY AS NEEDED FOR SLEEP/ANXIETY (Patient not taking: Reported on 10/06/2019) 540 capsule 1  . levocetirizine (XYZAL) 5 MG tablet TAKE 1 TABLET BY MOUTH IN THE MORNING (Patient not taking: Reported on 10/06/2019) 90 tablet 1  . [START ON 12/11/2019] valACYclovir (VALTREX) 500 MG tablet Take 1000mg  on day 1 of outbreak, then take 500mg  once daily for 3 days. (Patient not taking: Reported on 10/06/2019) 10 tablet 5   No current facility-administered medications for this visit.      PHYSICAL EXAMINATION:   Vitals:   10/06/19 1119  BP: 112/68  Pulse: 75  Resp: 16  Temp: 98.2 F (36.8 C)  SpO2: 100%   Filed Weights   10/06/19 1119  Weight: 161 lb (73 kg)    Physical Exam HENT:     Head: Normocephalic and atraumatic.     Mouth/Throat:     Pharynx: No oropharyngeal exudate.  Eyes:     Pupils: Pupils are equal, round, and reactive to light.  Cardiovascular:     Rate and Rhythm: Normal rate and regular rhythm.  Pulmonary:     Effort: Pulmonary effort is normal. No respiratory distress.     Breath sounds: Normal breath sounds. No wheezing.  Abdominal:     General: Bowel sounds are normal. There is no distension.     Palpations: Abdomen is soft. There is no mass.     Tenderness: There is no abdominal tenderness. There is no guarding or rebound.  Musculoskeletal:        General: No tenderness. Normal range of motion.     Cervical back: Normal range of motion and neck supple.   Skin:    General: Skin is warm.  Neurological:     Mental Status: She is alert and oriented to person, place, and time.  Psychiatric:        Mood and Affect: Affect normal.     LABORATORY DATA:  I have reviewed the data as listed Lab Results  Component Value Date   WBC 5.6 09/20/2019   HGB 8.6 (L) 09/20/2019   HCT 30.1 (L) 09/20/2019   MCV 72.4 (L) 09/20/2019   PLT 343 09/20/2019   Recent Labs    09/20/19 1529  NA 138  K 4.1  CL 108  CO2 26  GLUCOSE 75  BUN 10  CREATININE 0.67  CALCIUM 8.6  GFRNONAA 110  GFRAA 127  PROT 6.6  AST 19  ALT 13  BILITOT  0.2     No results found.  Iron deficiency anemia following bariatric surgery #Iron deficiency secondary gastric bypass-anemia 8.3; microcytic.;  Iron saturation 3% ferritin 3.-Proceed with Venofer weekly x4.  Discussed the potential acute infusion reactions with IV iron; which are quite rare.  Patient understands the risk; will proceed with infusions.  #B12-deficiency on B12 injections PCP  # DISPOSITION:  # Venofer weekly x 4 # cbc in 4 weeks # follow up in 8 weeks- MD; labs- cbc; Possible Venofer-Dr.B  # Thank you, Ms. Tapia PA-C for allowing me to participate in the care of your pleasant patient. Please do not hesitate to contact me with questions or concerns in the interim.   All questions were answered. The patient knows to call the clinic with any problems, questions or concerns.     Earna Coder, MD 10/06/2019 12:08 PM

## 2019-10-06 NOTE — Assessment & Plan Note (Addendum)
#  Iron deficiency secondary gastric bypass-anemia 8.3; microcytic.;  Iron saturation 3% ferritin 3.-Proceed with Venofer weekly x4.  Discussed the potential acute infusion reactions with IV iron; which are quite rare.  Patient understands the risk; will proceed with infusions.  #B12-deficiency on B12 injections PCP  # DISPOSITION:  # Venofer weekly x 4 # cbc in 4 weeks # follow up in 8 weeks- MD; labs- cbc; Possible Venofer-Dr.B  # Thank you, Ms. Tapia PA-C for allowing me to participate in the care of your pleasant patient. Please do not hesitate to contact me with questions or concerns in the interim.

## 2019-10-13 ENCOUNTER — Inpatient Hospital Stay: Payer: BC Managed Care – PPO

## 2019-10-13 ENCOUNTER — Other Ambulatory Visit: Payer: Self-pay

## 2019-10-13 VITALS — BP 126/82 | HR 72 | Temp 98.6°F | Resp 16

## 2019-10-13 DIAGNOSIS — E538 Deficiency of other specified B group vitamins: Secondary | ICD-10-CM | POA: Diagnosis not present

## 2019-10-13 DIAGNOSIS — Z90710 Acquired absence of both cervix and uterus: Secondary | ICD-10-CM | POA: Diagnosis not present

## 2019-10-13 DIAGNOSIS — Z9884 Bariatric surgery status: Secondary | ICD-10-CM | POA: Diagnosis not present

## 2019-10-13 DIAGNOSIS — Z79899 Other long term (current) drug therapy: Secondary | ICD-10-CM | POA: Diagnosis not present

## 2019-10-13 DIAGNOSIS — D509 Iron deficiency anemia, unspecified: Secondary | ICD-10-CM

## 2019-10-13 DIAGNOSIS — K9589 Other complications of other bariatric procedure: Secondary | ICD-10-CM

## 2019-10-13 DIAGNOSIS — F329 Major depressive disorder, single episode, unspecified: Secondary | ICD-10-CM | POA: Diagnosis not present

## 2019-10-13 DIAGNOSIS — Z87891 Personal history of nicotine dependence: Secondary | ICD-10-CM | POA: Diagnosis not present

## 2019-10-13 MED ORDER — IRON SUCROSE 20 MG/ML IV SOLN
200.0000 mg | Freq: Once | INTRAVENOUS | Status: AC
  Administered 2019-10-13: 200 mg via INTRAVENOUS
  Filled 2019-10-13: qty 10

## 2019-10-13 MED ORDER — SODIUM CHLORIDE 0.9 % IV SOLN: 200.0000 mg | Freq: Once | INTRAVENOUS | Status: DC

## 2019-10-13 MED ORDER — SODIUM CHLORIDE 0.9 % IV SOLN
Freq: Once | INTRAVENOUS | Status: AC
  Filled 2019-10-13: qty 250

## 2019-10-20 ENCOUNTER — Inpatient Hospital Stay: Payer: BC Managed Care – PPO

## 2019-10-20 ENCOUNTER — Other Ambulatory Visit: Payer: Self-pay

## 2019-10-20 VITALS — BP 120/77 | HR 74 | Temp 98.8°F

## 2019-10-20 DIAGNOSIS — Z79899 Other long term (current) drug therapy: Secondary | ICD-10-CM | POA: Diagnosis not present

## 2019-10-20 DIAGNOSIS — K9589 Other complications of other bariatric procedure: Secondary | ICD-10-CM

## 2019-10-20 DIAGNOSIS — Z87891 Personal history of nicotine dependence: Secondary | ICD-10-CM | POA: Diagnosis not present

## 2019-10-20 DIAGNOSIS — Z9884 Bariatric surgery status: Secondary | ICD-10-CM | POA: Diagnosis not present

## 2019-10-20 DIAGNOSIS — F329 Major depressive disorder, single episode, unspecified: Secondary | ICD-10-CM | POA: Diagnosis not present

## 2019-10-20 DIAGNOSIS — D509 Iron deficiency anemia, unspecified: Secondary | ICD-10-CM

## 2019-10-20 DIAGNOSIS — Z90710 Acquired absence of both cervix and uterus: Secondary | ICD-10-CM | POA: Diagnosis not present

## 2019-10-20 DIAGNOSIS — E538 Deficiency of other specified B group vitamins: Secondary | ICD-10-CM | POA: Diagnosis not present

## 2019-10-20 MED ORDER — SODIUM CHLORIDE 0.9 % IV SOLN
Freq: Once | INTRAVENOUS | Status: AC
  Filled 2019-10-20: qty 250

## 2019-10-20 MED ORDER — IRON SUCROSE 20 MG/ML IV SOLN
200.0000 mg | Freq: Once | INTRAVENOUS | Status: AC
  Administered 2019-10-20: 200 mg via INTRAVENOUS
  Filled 2019-10-20: qty 10

## 2019-10-20 MED ORDER — SODIUM CHLORIDE 0.9 % IV SOLN: 200.0000 mg | Freq: Once | INTRAVENOUS | Status: DC

## 2019-10-27 ENCOUNTER — Other Ambulatory Visit: Payer: BC Managed Care – PPO

## 2019-10-27 ENCOUNTER — Other Ambulatory Visit: Payer: Self-pay

## 2019-10-27 ENCOUNTER — Inpatient Hospital Stay: Payer: BC Managed Care – PPO

## 2019-10-27 VITALS — BP 109/73 | HR 68 | Temp 96.8°F | Resp 17

## 2019-10-27 DIAGNOSIS — Z9884 Bariatric surgery status: Secondary | ICD-10-CM | POA: Diagnosis not present

## 2019-10-27 DIAGNOSIS — K9589 Other complications of other bariatric procedure: Secondary | ICD-10-CM

## 2019-10-27 DIAGNOSIS — Z79899 Other long term (current) drug therapy: Secondary | ICD-10-CM | POA: Diagnosis not present

## 2019-10-27 DIAGNOSIS — E538 Deficiency of other specified B group vitamins: Secondary | ICD-10-CM | POA: Diagnosis not present

## 2019-10-27 DIAGNOSIS — Z90710 Acquired absence of both cervix and uterus: Secondary | ICD-10-CM | POA: Diagnosis not present

## 2019-10-27 DIAGNOSIS — D509 Iron deficiency anemia, unspecified: Secondary | ICD-10-CM | POA: Diagnosis not present

## 2019-10-27 DIAGNOSIS — Z87891 Personal history of nicotine dependence: Secondary | ICD-10-CM | POA: Diagnosis not present

## 2019-10-27 DIAGNOSIS — F329 Major depressive disorder, single episode, unspecified: Secondary | ICD-10-CM | POA: Diagnosis not present

## 2019-10-27 MED ORDER — SODIUM CHLORIDE 0.9 % IV SOLN: 200.0000 mg | Freq: Once | INTRAVENOUS | Status: DC

## 2019-10-27 MED ORDER — IRON SUCROSE 20 MG/ML IV SOLN
200.0000 mg | Freq: Once | INTRAVENOUS | Status: AC
  Administered 2019-10-27: 200 mg via INTRAVENOUS
  Filled 2019-10-27: qty 10

## 2019-10-27 MED ORDER — SODIUM CHLORIDE 0.9 % IV SOLN
Freq: Once | INTRAVENOUS | Status: AC
  Filled 2019-10-27: qty 250

## 2019-10-27 NOTE — Progress Notes (Signed)
Pt tolerated infusion well. No s/s of distress or reaction noted. Pt and VS stable at discharge.  

## 2019-11-03 ENCOUNTER — Other Ambulatory Visit: Payer: Self-pay

## 2019-11-03 ENCOUNTER — Inpatient Hospital Stay: Payer: BC Managed Care – PPO

## 2019-11-03 DIAGNOSIS — L509 Urticaria, unspecified: Secondary | ICD-10-CM

## 2019-11-03 MED ORDER — LEVOCETIRIZINE DIHYDROCHLORIDE 5 MG PO TABS: 5.0000 mg | ORAL_TABLET | Freq: Every morning | ORAL | 3 refills | Status: DC

## 2019-11-16 ENCOUNTER — Inpatient Hospital Stay: Payer: 59 | Attending: Internal Medicine

## 2019-11-16 ENCOUNTER — Other Ambulatory Visit: Payer: Self-pay | Admitting: *Deleted

## 2019-11-16 ENCOUNTER — Inpatient Hospital Stay: Payer: 59

## 2019-11-16 ENCOUNTER — Other Ambulatory Visit: Payer: Self-pay

## 2019-11-16 VITALS — BP 108/74 | HR 69 | Temp 97.0°F

## 2019-11-16 DIAGNOSIS — Z79899 Other long term (current) drug therapy: Secondary | ICD-10-CM | POA: Diagnosis not present

## 2019-11-16 DIAGNOSIS — K9589 Other complications of other bariatric procedure: Secondary | ICD-10-CM

## 2019-11-16 DIAGNOSIS — D509 Iron deficiency anemia, unspecified: Secondary | ICD-10-CM | POA: Diagnosis present

## 2019-11-16 DIAGNOSIS — D508 Other iron deficiency anemias: Secondary | ICD-10-CM

## 2019-11-16 LAB — CBC WITH DIFFERENTIAL/PLATELET
Abs Immature Granulocytes: 0.01 10*3/uL (ref 0.00–0.07)
Basophils Absolute: 0 10*3/uL (ref 0.0–0.1)
Basophils Relative: 0 %
Eosinophils Absolute: 0.1 10*3/uL (ref 0.0–0.5)
Eosinophils Relative: 1 %
HCT: 34.5 % — ABNORMAL LOW (ref 36.0–46.0)
Hemoglobin: 11.4 g/dL — ABNORMAL LOW (ref 12.0–15.0)
Immature Granulocytes: 0 %
Lymphocytes Relative: 38 %
Lymphs Abs: 2.8 10*3/uL (ref 0.7–4.0)
MCH: 24.3 pg — ABNORMAL LOW (ref 26.0–34.0)
MCHC: 33 g/dL (ref 30.0–36.0)
MCV: 73.6 fL — ABNORMAL LOW (ref 80.0–100.0)
Monocytes Absolute: 0.5 10*3/uL (ref 0.1–1.0)
Monocytes Relative: 7 %
Neutro Abs: 3.8 10*3/uL (ref 1.7–7.7)
Neutrophils Relative %: 54 %
Platelets: 282 10*3/uL (ref 150–400)
RBC: 4.69 MIL/uL (ref 3.87–5.11)
RDW: 22.5 % — ABNORMAL HIGH (ref 11.5–15.5)
WBC: 7.3 10*3/uL (ref 4.0–10.5)
nRBC: 0 % (ref 0.0–0.2)

## 2019-11-16 MED ORDER — SODIUM CHLORIDE 0.9 % IV SOLN
Freq: Once | INTRAVENOUS | Status: AC
  Filled 2019-11-16: qty 250

## 2019-11-16 MED ORDER — IRON SUCROSE 20 MG/ML IV SOLN
200.0000 mg | Freq: Once | INTRAVENOUS | Status: AC
  Administered 2019-11-16: 200 mg via INTRAVENOUS
  Filled 2019-11-16: qty 10

## 2019-11-16 MED ORDER — SODIUM CHLORIDE 0.9 % IV SOLN: 200.0000 mg | Freq: Once | INTRAVENOUS | Status: DC

## 2019-11-30 ENCOUNTER — Other Ambulatory Visit: Payer: Self-pay | Admitting: Family Medicine

## 2019-11-30 DIAGNOSIS — F419 Anxiety disorder, unspecified: Secondary | ICD-10-CM

## 2019-11-30 DIAGNOSIS — F320 Major depressive disorder, single episode, mild: Secondary | ICD-10-CM

## 2019-11-30 DIAGNOSIS — Z8659 Personal history of other mental and behavioral disorders: Secondary | ICD-10-CM

## 2019-12-01 ENCOUNTER — Inpatient Hospital Stay: Payer: 59

## 2019-12-01 ENCOUNTER — Inpatient Hospital Stay: Payer: 59 | Attending: Internal Medicine

## 2019-12-01 ENCOUNTER — Inpatient Hospital Stay (HOSPITAL_BASED_OUTPATIENT_CLINIC_OR_DEPARTMENT_OTHER): Payer: 59 | Admitting: Internal Medicine

## 2019-12-01 ENCOUNTER — Other Ambulatory Visit: Payer: Self-pay

## 2019-12-01 DIAGNOSIS — K9589 Other complications of other bariatric procedure: Secondary | ICD-10-CM

## 2019-12-01 DIAGNOSIS — D508 Other iron deficiency anemias: Secondary | ICD-10-CM | POA: Diagnosis not present

## 2019-12-01 DIAGNOSIS — D509 Iron deficiency anemia, unspecified: Secondary | ICD-10-CM | POA: Insufficient documentation

## 2019-12-01 LAB — CBC WITH DIFFERENTIAL/PLATELET
Abs Immature Granulocytes: 0.05 10*3/uL (ref 0.00–0.07)
Basophils Absolute: 0 10*3/uL (ref 0.0–0.1)
Basophils Relative: 1 %
Eosinophils Absolute: 0.1 10*3/uL (ref 0.0–0.5)
Eosinophils Relative: 2 %
HCT: 34 % — ABNORMAL LOW (ref 36.0–46.0)
Hemoglobin: 11.4 g/dL — ABNORMAL LOW (ref 12.0–15.0)
Immature Granulocytes: 1 %
Lymphocytes Relative: 43 %
Lymphs Abs: 2.2 10*3/uL (ref 0.7–4.0)
MCH: 25.3 pg — ABNORMAL LOW (ref 26.0–34.0)
MCHC: 33.5 g/dL (ref 30.0–36.0)
MCV: 75.6 fL — ABNORMAL LOW (ref 80.0–100.0)
Monocytes Absolute: 0.4 10*3/uL (ref 0.1–1.0)
Monocytes Relative: 7 %
Neutro Abs: 2.5 10*3/uL (ref 1.7–7.7)
Neutrophils Relative %: 46 %
Platelets: 313 10*3/uL (ref 150–400)
RBC: 4.5 MIL/uL (ref 3.87–5.11)
RDW: 21.7 % — ABNORMAL HIGH (ref 11.5–15.5)
WBC: 5.2 10*3/uL (ref 4.0–10.5)
nRBC: 0 % (ref 0.0–0.2)

## 2019-12-01 NOTE — Assessment & Plan Note (Addendum)
#  Iron deficiency secondary gastric bypass-anemia 8.3; microcytic.;  Iron saturation 3% ferritin 3.-S/p Venofer weekly x4 hemoglobin 11.4.  #Patient symptomatically improved.  I think is reasonable to hold off IV Venofer today.  Per patient preference plan Venofer in 2 weeks.   #B12-deficiency on B12 injections PCP  # DISPOSITION:  # NO venofer today # in 2 weeks- Venofer # follow up in 43months- MD; labs- cbc;iron studies/ferritin- Possible Venofer-Dr.B

## 2019-12-01 NOTE — Progress Notes (Signed)
I connected with [name] on 12/01/19 at  1:00 PM EDT by video enabled telemedicine visit and verified that I am speaking with the correct person using two identifiers.  I discussed the limitations, risks, security and privacy concerns of performing an evaluation and management service by telemedicine and the availability of in-person appointments. I also discussed with the patient that there may be a patient responsible charge related to this service. The patient expressed understanding and agreed to proceed.    Other persons participating in the visit and their role in the encounter: RN/medical reconciliation Patient's location: office Provider's location: office  Oncology History   No history exists.    Chief Complaint: Iron deficient anemia   History of present illness:Sandra Munoz 39 y.o.  female with history of iron deficient anemia/gastric bypass/malabsorption is here for follow-up.  In the interim patient received IV iron/Venofer x4.  Her energy levels are improved.  She feels much improved from baseline.   Observation/objective: Hemoglobin 11.4.  MCV 75. Assessment and plan: Iron deficiency anemia following bariatric surgery #Iron deficiency secondary gastric bypass-anemia 8.3; microcytic.;  Iron saturation 3% ferritin 3.-S/p Venofer weekly x4 hemoglobin 11.4.  #Patient symptomatically improved.  I think is reasonable to hold off IV Venofer today.  Per patient preference plan Venofer in 2 weeks.   #B12-deficiency on B12 injections PCP  # DISPOSITION:  # NO venofer today # in 2 weeks- Venofer # follow up in 19months- MD; labs- cbc;iron studies/ferritin- Possible Venofer-Dr.B     Follow-up instructions:  I discussed the assessment and treatment plan with the patient.  The patient was provided an opportunity to ask questions and all were answered.  The patient agreed with the plan and demonstrated understanding of instructions.  The patient was advised to call back  or seek an in person evaluation if the symptoms worsen or if the condition fails to improve as anticipated.   Dr. Louretta Shorten [facility] at [facility] 12/01/2019 1:18 PM

## 2019-12-15 ENCOUNTER — Inpatient Hospital Stay: Payer: 59

## 2020-02-01 ENCOUNTER — Other Ambulatory Visit: Payer: Self-pay | Admitting: Family Medicine

## 2020-02-01 DIAGNOSIS — F320 Major depressive disorder, single episode, mild: Secondary | ICD-10-CM

## 2020-02-01 DIAGNOSIS — F419 Anxiety disorder, unspecified: Secondary | ICD-10-CM

## 2020-02-01 MED ORDER — ESCITALOPRAM OXALATE 10 MG PO TABS: 15.0000 mg | ORAL_TABLET | Freq: Every day | ORAL | 1 refills | Status: DC

## 2020-02-01 NOTE — Telephone Encounter (Signed)
Medication Refill - Medication: escitalopram (LEXAPRO) 10 MG tablet     Preferred Pharmacy (with phone number or street name):  [id number] [facility] [address] Phone:  [phone]  Fax:  [phone]       Agent: Please be advised that RX refills may take up to 3 business days. We ask that you follow-up with your pharmacy.

## 2020-02-06 ENCOUNTER — Inpatient Hospital Stay (HOSPITAL_BASED_OUTPATIENT_CLINIC_OR_DEPARTMENT_OTHER): Payer: 59 | Admitting: Internal Medicine

## 2020-02-06 ENCOUNTER — Other Ambulatory Visit: Payer: Self-pay

## 2020-02-06 ENCOUNTER — Encounter: Payer: Self-pay | Admitting: Internal Medicine

## 2020-02-06 ENCOUNTER — Inpatient Hospital Stay: Payer: 59 | Attending: Internal Medicine

## 2020-02-06 ENCOUNTER — Inpatient Hospital Stay: Payer: 59

## 2020-02-06 VITALS — BP 125/83 | HR 64 | Resp 16

## 2020-02-06 DIAGNOSIS — K9589 Other complications of other bariatric procedure: Secondary | ICD-10-CM

## 2020-02-06 DIAGNOSIS — D508 Other iron deficiency anemias: Secondary | ICD-10-CM

## 2020-02-06 DIAGNOSIS — Z87891 Personal history of nicotine dependence: Secondary | ICD-10-CM | POA: Insufficient documentation

## 2020-02-06 DIAGNOSIS — K909 Intestinal malabsorption, unspecified: Secondary | ICD-10-CM | POA: Insufficient documentation

## 2020-02-06 DIAGNOSIS — Z9884 Bariatric surgery status: Secondary | ICD-10-CM | POA: Insufficient documentation

## 2020-02-06 DIAGNOSIS — Z79899 Other long term (current) drug therapy: Secondary | ICD-10-CM | POA: Insufficient documentation

## 2020-02-06 DIAGNOSIS — E282 Polycystic ovarian syndrome: Secondary | ICD-10-CM | POA: Diagnosis not present

## 2020-02-06 DIAGNOSIS — R5383 Other fatigue: Secondary | ICD-10-CM | POA: Diagnosis not present

## 2020-02-06 DIAGNOSIS — D509 Iron deficiency anemia, unspecified: Secondary | ICD-10-CM | POA: Diagnosis not present

## 2020-02-06 DIAGNOSIS — F32A Depression, unspecified: Secondary | ICD-10-CM | POA: Diagnosis not present

## 2020-02-06 DIAGNOSIS — M791 Myalgia, unspecified site: Secondary | ICD-10-CM | POA: Diagnosis not present

## 2020-02-06 LAB — CBC WITH DIFFERENTIAL/PLATELET
Abs Immature Granulocytes: 0.01 10*3/uL (ref 0.00–0.07)
Basophils Absolute: 0 10*3/uL (ref 0.0–0.1)
Basophils Relative: 1 %
Eosinophils Absolute: 0.1 10*3/uL (ref 0.0–0.5)
Eosinophils Relative: 2 %
HCT: 35.7 % — ABNORMAL LOW (ref 36.0–46.0)
Hemoglobin: 12.3 g/dL (ref 12.0–15.0)
Immature Granulocytes: 0 %
Lymphocytes Relative: 45 %
Lymphs Abs: 2.5 10*3/uL (ref 0.7–4.0)
MCH: 29 pg (ref 26.0–34.0)
MCHC: 34.5 g/dL (ref 30.0–36.0)
MCV: 84.2 fL (ref 80.0–100.0)
Monocytes Absolute: 0.4 10*3/uL (ref 0.1–1.0)
Monocytes Relative: 6 %
Neutro Abs: 2.6 10*3/uL (ref 1.7–7.7)
Neutrophils Relative %: 46 %
Platelets: 287 10*3/uL (ref 150–400)
RBC: 4.24 MIL/uL (ref 3.87–5.11)
RDW: 13.2 % (ref 11.5–15.5)
WBC: 5.6 10*3/uL (ref 4.0–10.5)
nRBC: 0 % (ref 0.0–0.2)

## 2020-02-06 LAB — IRON AND TIBC
Iron: 34 ug/dL (ref 28–170)
Saturation Ratios: 9 % — ABNORMAL LOW (ref 10.4–31.8)
TIBC: 393 ug/dL (ref 250–450)
UIBC: 359 ug/dL

## 2020-02-06 LAB — FERRITIN: Ferritin: 18 ng/mL (ref 11–307)

## 2020-02-06 MED ORDER — SODIUM CHLORIDE 0.9 % IV SOLN
Freq: Once | INTRAVENOUS | Status: AC
  Filled 2020-02-06: qty 250

## 2020-02-06 MED ORDER — IRON SUCROSE 20 MG/ML IV SOLN
200.0000 mg | Freq: Once | INTRAVENOUS | Status: AC
  Administered 2020-02-06: 200 mg via INTRAVENOUS
  Filled 2020-02-06: qty 10

## 2020-02-06 MED ORDER — SODIUM CHLORIDE 0.9 % IV SOLN: 200.0000 mg | Freq: Once | INTRAVENOUS | Status: DC

## 2020-02-06 NOTE — Assessment & Plan Note (Addendum)
#  Iron deficiency secondary gastric bypass-anemia 8.3; microcytic.;  Iron saturation 3% ferritin 3.-S/p Venofer weekly x4 hemoglobin-12.2. pt symptomatic [cold/fatigue]. Proceed with venofer.   #B12-deficiency on B12 injections PCP/home  # DISPOSITION: will call with iron- in 3 month # Venofer today # follow up in 6 months- MD; labs- cbc;iron studies/ferritin- Possible Venofer-Dr.B  Addendum: Discussed iron studies saturation 9%; ferritin-18; low end of normal.  Since she got infusion today I would not recommend any further infusions at this time.  Patient agreement.

## 2020-02-06 NOTE — Progress Notes (Signed)
Patient tolerated treatment well. Patient and VSS. Discharged home  

## 2020-02-06 NOTE — Progress Notes (Signed)
[facility] CONSULT NOTE  Patient Care Team: Danelle Berry, PA-C as PCP - General (Family Medicine)  CHIEF COMPLAINTS/PURPOSE OF CONSULTATION:    HEMATOLOGY HISTORY:   # ANEMIA [sep 2016- gastric bypass; [place]; > 100 pounds]   # TAH [fibroid; Ovaries intact- 2019]; EGD/colonoscopy- 2016/ at gastric bypass  HISTORY OF PRESENTING ILLNESS:  Sandra Munoz 39 y.o.  female anemia secondary to B12 deficiency/iron malabsorption from gastric bypass is here for follow-up.  Patient energy levels improved s/p IV iron infusions.  However the last few months noted to have progressive fatigue.  Complains of myalgias.   Review of Systems  Constitutional: Positive for malaise/fatigue. Negative for chills, diaphoresis, fever and weight loss.  HENT: Negative for nosebleeds and sore throat.   Eyes: Negative for double vision.  Respiratory: Negative for cough, hemoptysis, sputum production and wheezing.   Cardiovascular: Negative for chest pain, palpitations, orthopnea and leg swelling.  Gastrointestinal: Negative for abdominal pain, blood in stool, constipation, diarrhea, heartburn, melena, nausea and vomiting.  Genitourinary: Negative for dysuria, frequency and urgency.  Musculoskeletal: Positive for myalgias. Negative for back pain and joint pain.  Skin: Negative.  Negative for itching and rash.  Neurological: Negative for dizziness, tingling, focal weakness, weakness and headaches.  Endo/Heme/Allergies: Does not bruise/bleed easily.  Psychiatric/Behavioral: Negative for depression. The patient is not nervous/anxious and does not have insomnia.     MEDICAL HISTORY:  Past Medical History:  Diagnosis Date  . Allergy   . Depression   . Polycystic ovarian syndrome     SURGICAL HISTORY: Past Surgical History:  Procedure Laterality Date  . ABDOMINAL HYSTERECTOMY    . GASTRIC BYPASS      SOCIAL HISTORY: Social History   Socioeconomic History  . Marital status:  Married    Spouse name: Cristal Deer  . Number of children: 1  . Years of education: Not on file  . Highest education level: Not on file  Occupational History  . Not on file  Tobacco Use  . Smoking status: Former Smoker    Types: Cigarettes    Quit date: 09/30/2009    Years since quitting: 10.3  . Smokeless tobacco: Never Used  Vaping Use  . Vaping Use: Never used  Substance and Sexual Activity  . Alcohol use: Yes    Comment: ocasional  . Drug use: Never  . Sexual activity: Yes    Birth control/protection: Surgical  Other Topics Concern  . 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    Social Determinants of Health   Financial Resource Strain:   . Difficulty of Paying Living Expenses: Not on file  Food Insecurity:   . Worried About Programme researcher, broadcasting/film/video in the Last Year: Not on file  . Ran Out of Food in the Last Year: Not on file  Transportation Needs:   . Lack of Transportation (Medical): Not on file  . Lack of Transportation (Non-Medical): Not on file  Physical Activity:   . Days of Exercise per Week: Not on file  . Minutes of Exercise per Session: Not on file  Stress:   . Feeling of Stress : Not on file  Social Connections:   . Frequency of Communication with Friends and Family: Not on file  . Frequency of Social Gatherings with Friends and Family: Not on file  . Attends Religious Services: Not on file  . Active Member of Clubs or Organizations: Not on  file  . Attends Banker Meetings: Not on file  . Marital Status: Not on file  Intimate Partner Violence:   . Fear of Current or Ex-Partner: Not on file  . Emotionally Abused: Not on file  . Physically Abused: Not on file  . Sexually Abused: Not on file    FAMILY HISTORY: Family History  Problem Relation Age of Onset  . Cancer Mother        SCC of skin/Involved throat  . Autoimmune disease Mother     ALLERGIES:   is allergic to corn-containing products and milk protein.  MEDICATIONS:  Current Outpatient Medications  Medication Sig Dispense Refill  . busPIRone (BUSPAR) 10 MG tablet TAKE 1 TABLET ONCE DAILY AT NIGHT. 90 tablet 1  . cyanocobalamin (,VITAMIN B-12,) 1000 MCG/ML injection INJECT 1 ML INTO THE SKIN INTRAMUSCULARLY ONCE EVERY MONTH 1 mL 11  . escitalopram (LEXAPRO) 10 MG tablet Take 1.5 tablets (15 mg total) by mouth daily. 135 tablet 1  . levocetirizine (XYZAL) 5 MG tablet Take 1 tablet (5 mg total) by mouth every morning. 90 tablet 3   No current facility-administered medications for this visit.      PHYSICAL EXAMINATION:   Vitals:   02/06/20 1329  BP: 123/65  Pulse: 69  Resp: 16  Temp: (!) 97.2 F (36.2 C)  SpO2: 100%   Filed Weights   02/06/20 1329  Weight: 158 lb (71.7 kg)    Physical Exam HENT:     Head: Normocephalic and atraumatic.     Mouth/Throat:     Pharynx: No oropharyngeal exudate.  Eyes:     Pupils: Pupils are equal, round, and reactive to light.  Cardiovascular:     Rate and Rhythm: Normal rate and regular rhythm.  Pulmonary:     Effort: Pulmonary effort is normal. No respiratory distress.     Breath sounds: Normal breath sounds. No wheezing.  Abdominal:     General: Bowel sounds are normal. There is no distension.     Palpations: Abdomen is soft. There is no mass.     Tenderness: There is no abdominal tenderness. There is no guarding or rebound.  Musculoskeletal:        General: No tenderness. Normal range of motion.     Cervical back: Normal range of motion and neck supple.  Skin:    General: Skin is warm.  Neurological:     Mental Status: She is alert and oriented to person, place, and time.  Psychiatric:        Mood and Affect: Affect normal.     LABORATORY DATA:  I have reviewed the data as listed Lab Results  Component Value Date   WBC 5.6 02/06/2020   HGB 12.3 02/06/2020   HCT 35.7 (L) 02/06/2020   MCV 84.2 02/06/2020   PLT  287 02/06/2020   Recent Labs    09/20/19 1529  NA 138  K 4.1  CL 108  CO2 26  GLUCOSE 75  BUN 10  CREATININE 0.67  CALCIUM 8.6  GFRNONAA 110  GFRAA 127  PROT 6.6  AST 19  ALT 13  BILITOT 0.2     No results found.  Iron deficiency anemia following bariatric surgery #Iron deficiency secondary gastric bypass-anemia 8.3; microcytic.;  Iron saturation 3% ferritin 3.-S/p Venofer weekly x4 hemoglobin-12.2. pt symptomatic [cold/fatigue]. Proceed with venofer.   #B12-deficiency on B12 injections PCP/home  # DISPOSITION: will call with iron- in 3 month # Venofer today # follow up in 6  months- MD; labs- cbc;iron studies/ferritin- Possible Venofer-Dr.B  Addendum: Discussed iron studies saturation 9%; ferritin-18; low end of normal.  Since she got infusion today I would not recommend any further infusions at this time.  Patient agreement.   All questions were answered. The patient knows to call the clinic with any problems, questions or concerns.     Earna Coder, MD 02/06/2020 8:10 PM

## 2020-02-14 ENCOUNTER — Other Ambulatory Visit: Payer: Self-pay

## 2020-02-14 ENCOUNTER — Ambulatory Visit: Payer: 59 | Admitting: Family Medicine

## 2020-02-14 ENCOUNTER — Encounter: Payer: Self-pay | Admitting: Family Medicine

## 2020-02-14 VITALS — BP 118/72 | HR 81 | Temp 98.1°F | Resp 16 | Ht 60.0 in | Wt 160.6 lb

## 2020-02-14 DIAGNOSIS — F5105 Insomnia due to other mental disorder: Secondary | ICD-10-CM | POA: Diagnosis not present

## 2020-02-14 DIAGNOSIS — Z8659 Personal history of other mental and behavioral disorders: Secondary | ICD-10-CM | POA: Diagnosis not present

## 2020-02-14 DIAGNOSIS — F419 Anxiety disorder, unspecified: Secondary | ICD-10-CM

## 2020-02-14 DIAGNOSIS — F99 Mental disorder, not otherwise specified: Secondary | ICD-10-CM

## 2020-02-14 DIAGNOSIS — F909 Attention-deficit hyperactivity disorder, unspecified type: Secondary | ICD-10-CM

## 2020-02-14 DIAGNOSIS — F320 Major depressive disorder, single episode, mild: Secondary | ICD-10-CM

## 2020-02-14 NOTE — Progress Notes (Signed)
Patient ID: Sandra Munoz, female    DOB: [birth year], 39 y.o.   MRN: [id number]  PCP: Danelle Berry, PA-C  Chief Complaint  Patient presents with  . Referral    ADHD    Subjective:   Sandra Munoz is a 39 y.o. female, presents to clinic with CC of the following:  HPI  ADHD: pt presents for concerns of ADHD, wants referral for assessment and management No past eval for ADHD, hx of OCD and anxiety, she is working with a Writer and she had noted many symptoms suspicious for ADHD  OCD and anxiety  lexapro before 2014, sx ever since she was younger, switched to zoloft for pregnancy, then back to lexapro last year  [place] marriage counselor/therapist - Candise Bowens   Adult ADHD Self-Report Scale (ASRS-v1.1) Symptom Checklist Will be scanned into chart - highly positive in both sections  Insomnia - trouble getting to sleep and staying asleep, no snoring Depression screen [facility] 2/9 02/14/2020 09/20/2019 09/07/2019  Decreased Interest 0 0 0  Down, Depressed, Hopeless 1 0 0  PHQ - 2 Score 1 0 0  Altered sleeping 3 1 0  Tired, decreased energy 3 0 0  Change in appetite 2 0 0  Feeling bad or failure about yourself  0 0 0  Trouble concentrating 3 0 0  Moving slowly or fidgety/restless 3 0 0  Suicidal thoughts 0 0 0  PHQ-9 Score 15 1 0  Difficult doing work/chores Somewhat difficult Not difficult at all Not difficult at all  Some recent data might be hidden  doesn't feel sad, just a lot of stress and frustration all the time  GAD 7 : Generalized Anxiety Score 02/14/2020 01/03/2019  Nervous, Anxious, on Edge 3 3  Control/stop worrying 1 2  Worry too much - different things 2 2  Trouble relaxing 3 3  Restless 3 3  Easily annoyed or irritable 2 3  Afraid - awful might happen 0 0  Total GAD 7 Score 14 16  Anxiety Difficulty Somewhat difficult Very difficult   [facility]  An accountant, 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 Vitamin D deficiency 09/20/2019  . Class 1 obesity with body mass index (BMI) of 32.0 to 32.9 in adult 09/20/2019  . Hyperlipidemia 09/20/2019  . Current mild episode of major depressive disorder (HCC) 09/20/2019  . Insomnia due to other mental disorder 09/20/2019  . S/P gastric bypass 09/20/2019  . B12 deficiency 09/20/2019  . Iron deficiency anemia following bariatric surgery 09/20/2019  . History of OCD (obsessive compulsive disorder) 08/30/2018  . Anxiety disorder 08/30/2018  . Hx of cold sores 08/30/2018  . Urticaria of unknown origin 02/13/2013      Current Outpatient Medications:  .  cyanocobalamin (,VITAMIN B-12,) 1000 MCG/ML injection, INJECT 1 ML INTO THE SKIN INTRAMUSCULARLY ONCE EVERY MONTH, Disp: 1 mL, Rfl: 11 .  escitalopram (LEXAPRO) 10 MG tablet, Take 1.5 tablets (15 mg total) by mouth daily., Disp: 135 tablet, Rfl: 1 .  busPIRone (BUSPAR) 10 MG tablet, TAKE 1 TABLET ONCE DAILY AT NIGHT. (Patient not taking: Reported on 02/14/2020), Disp: 90 tablet, Rfl: 1 .  levocetirizine (XYZAL) 5 MG tablet, Take 1 tablet (5 mg total) by mouth every morning. (Patient not taking: Reported on 02/14/2020), Disp: 90 tablet, Rfl: 3   Allergies  Allergen Reactions  . Corn-Containing Products Hives  . Milk Protein Hives     Social History   Tobacco Use  . Smoking status: Former Smoker    Types: Cigarettes    Quit date: 09/30/2009    Years since quitting: 10.3  . Smokeless tobacco: Never Used  Vaping Use  . Vaping Use: Never used  Substance Use Topics  . Alcohol use: Yes    Comment: ocasional  . Drug use: Never      Chart Review Today: I personally reviewed active problem list, medication list, allergies, family history, social history, health maintenance, notes from last encounter, lab results, imaging with the  patient/caregiver today.   Review of Systems 10 Systems reviewed and are negative for acute change except as noted in the HPI.     Objective:   Vitals:   02/14/20 0936  BP: 118/72  Pulse: 81  Resp: 16  Temp: 98.1 F (36.7 C)  TempSrc: Oral  SpO2: 99%  Weight: 160 lb 9.6 oz (72.8 kg)  Height: 5' (1.524 m)    Body mass index is 31.37 kg/m.  Physical Exam Vitals and nursing note reviewed.  Constitutional:      General: She is not in acute distress.    Appearance: Normal appearance. She is well-developed. She is not ill-appearing, toxic-appearing or diaphoretic.     Interventions: Face mask in place.  HENT:     Head: Normocephalic and atraumatic.     Right Ear: External ear normal.     Left Ear: External ear normal.  Eyes:     General: Lids are normal. No scleral icterus.       Right eye: No discharge.        Left eye: No discharge.     Conjunctiva/sclera: Conjunctivae normal.  Neck:     Trachea: Phonation normal. No tracheal deviation.  Cardiovascular:     Rate and Rhythm: Normal rate and regular rhythm.     Pulses: Normal pulses.          Radial pulses are 2+ on the right side and 2+ on the left side.       Posterior tibial pulses are 2+ on the right side and 2+ on the left side.     Heart sounds: Normal heart sounds. No murmur heard. No friction rub. No gallop.   Pulmonary:     Effort: Pulmonary effort is normal. No respiratory distress.     Breath sounds: Normal breath sounds. No stridor. No wheezing, rhonchi or rales.  Chest:     Chest wall: No tenderness.  Abdominal:     General: Bowel sounds are normal. There is no distension.     Palpations: Abdomen is soft.  Musculoskeletal:     Right lower leg: No edema.     Left lower leg: No edema.  Skin:    General: Skin is warm and dry.     Coloration: Skin is not jaundiced or pale.     Findings: No rash.  Neurological:     Mental Status: She is alert.     Motor: No abnormal muscle tone.     Gait: Gait  normal.  Psychiatric:        Attention and Perception: Attention normal.        Mood and Affect: Mood and affect normal.        Speech: Speech normal.        Behavior: Behavior is hyperactive. Behavior is cooperative.  Thought Content: Thought content does not include homicidal or suicidal ideation. Thought content does not include homicidal or suicidal plan.      Results for orders placed or performed in visit on 02/06/20  Iron and TIBC  Result Value Ref Range   Iron 34 28 - 170 ug/dL   TIBC 161393 096250 - 045450 ug/dL   Saturation Ratios 9 (L) 10.4 - 31.8 %   UIBC 359 ug/dL  Ferritin  Result Value Ref Range   Ferritin 18 11 - 307 ng/mL  CBC with Differential  Result Value Ref Range   WBC 5.6 4.0 - 10.5 K/uL   RBC 4.24 3.87 - 5.11 MIL/uL   Hemoglobin 12.3 12.0 - 15.0 g/dL   HCT 40.935.7 (L) 81.136.0 - 91.446.0 %   MCV 84.2 80.0 - 100.0 fL   MCH 29.0 26.0 - 34.0 pg   MCHC 34.5 30.0 - 36.0 g/dL   RDW 78.213.2 95.611.5 - 21.315.5 %   Platelets 287 150 - 400 K/uL   nRBC 0.0 0.0 - 0.2 %   Neutrophils Relative % 46 %   Neutro Abs 2.6 1.7 - 7.7 K/uL   Lymphocytes Relative 45 %   Lymphs Abs 2.5 0.7 - 4.0 K/uL   Monocytes Relative 6 %   Monocytes Absolute 0.4 0.1 - 1.0 K/uL   Eosinophils Relative 2 %   Eosinophils Absolute 0.1 0.0 - 0.5 K/uL   Basophils Relative 1 %   Basophils Absolute 0.0 0.0 - 0.1 K/uL   Immature Granulocytes 0 %   Abs Immature Granulocytes 0.01 0.00 - 0.07 K/uL       Assessment & Plan:     ICD-10-CM   1. Current mild episode of major depressive disorder, unspecified whether recurrent (HCC)  F32.0   2. Insomnia due to other mental disorder  F51.05    F99   3. History of OCD (obsessive compulsive disorder)  Z86.59   4. Anxiety disorder, unspecified type  F41.9   5. Hyperactivity (behavior)  F90.9    Concern for adult ADHD, hyperactive, difficulty staying on task, she would like assessment per psychiatry or psychology   Patient was given a list of different resources and  local clinics which she may be able to call and get a assessment at give her [facility] and [facility] information for walk-in assessment, also advised her that I will put in a referral but she may need to contact us with what clinic she would like to go to who is excepting patients and who is in her insurance in network providers.  Patient symptoms do seem very suspicious for ADHD but is compounded with history of OCD, anxiety, insomnia and MDD  She feels her mood she is currently managing fairly well with Lexapro 15 mg daily, she does not feel down or depressed but she is dealing with a lot of stress and she is realizing possible root of some difficulty managing her stress and dealing with her marital relationships may be because of ADHD, she is working with a marriage therapist with suggested that she get a formal evaluation  I did offer to help with medical management once she has completed an assessment I did explain the risk and side effects to medications possibly that they could make anxiety or OCD worse or moods worse but there is a possibility that if she has ADHD and it was medically managed that that may help her with her other depressive or anxious symptoms.  Encouraged patient to let me know where  she like to go      Danelle Berry, PA-C 02/14/20 9:53 AM

## 2020-03-13 ENCOUNTER — Ambulatory Visit: Payer: 59 | Admitting: Family Medicine

## 2020-03-21 ENCOUNTER — Ambulatory Visit: Payer: BC Managed Care – PPO | Admitting: Family Medicine

## 2020-04-02 ENCOUNTER — Telehealth (INDEPENDENT_AMBULATORY_CARE_PROVIDER_SITE_OTHER): Payer: 59 | Admitting: Family Medicine

## 2020-04-02 ENCOUNTER — Encounter: Payer: Self-pay | Admitting: Family Medicine

## 2020-04-02 VITALS — Ht 59.0 in | Wt 156.2 lb

## 2020-04-02 DIAGNOSIS — F419 Anxiety disorder, unspecified: Secondary | ICD-10-CM | POA: Diagnosis not present

## 2020-04-02 DIAGNOSIS — F99 Mental disorder, not otherwise specified: Secondary | ICD-10-CM

## 2020-04-02 DIAGNOSIS — F320 Major depressive disorder, single episode, mild: Secondary | ICD-10-CM

## 2020-04-02 DIAGNOSIS — Z8659 Personal history of other mental and behavioral disorders: Secondary | ICD-10-CM | POA: Diagnosis not present

## 2020-04-02 DIAGNOSIS — F909 Attention-deficit hyperactivity disorder, unspecified type: Secondary | ICD-10-CM

## 2020-04-02 DIAGNOSIS — F5105 Insomnia due to other mental disorder: Secondary | ICD-10-CM

## 2020-04-02 MED ORDER — AMPHETAMINE-DEXTROAMPHETAMINE 10 MG PO TABS: ORAL_TABLET | ORAL | 0 refills | Status: DC

## 2020-04-02 NOTE — Progress Notes (Signed)
Name: Sandra Munoz   MRN: [id number]    DOB: Jul 03, 1980   Date:04/02/2020       Progress Note  Subjective:    Chief Complaint  Chief Complaint  Patient presents with  . Follow-up    I connected with  [name]  on 04/02/20 at  2:20 PM EST by a video enabled telemedicine application and verified that I am speaking with the correct person using two identifiers.  I discussed the limitations of evaluation and management by telemedicine and the availability of in person appointments. The patient expressed understanding and agreed to proceed. Staff also discussed with the patient that there may be a patient responsible charge related to this service. Patient Location:   Provider Location: [facility] Additional Individuals present: none  HPI  She is struggling with inability to focus - she is really struggling  Intake test is supposed to be completely medication free for 7 days, not able to get in until end of March  Closing on a house tomorrow [employer] work is continous stressor until the end of the year procrastinating work,  Sx that she is still struggling with are: Irritability, she feels less snappy than a few months ago Insomnia - getting about 6-8 hours   F/up on moods/ adhd  Past HPI  ADHD: pt presents for concerns of ADHD, wants referral for assessment and management No past eval for ADHD, hx of OCD and anxiety, she is working with a Writer and she had noted many symptoms suspicious for ADHD   OCD and anxiety  lexapro before 2014, sx ever since she was younger, switched to zoloft for pregnancy, then back to lexapro last year  [place] marriage counselor/therapist - Candise Bowens Still going every 3 weeks     Adult ADHD Self-Report Scale (ASRS-v1.1) Symptom Checklist Will be scanned into chart - highly positive in both sections   Insomnia - trouble getting to sleep and staying asleep, no snoring  Current screening, GAD 7 and PHQ 9  reviewed GAD 7 : Generalized Anxiety Score 04/02/2020 02/14/2020 01/03/2019  Nervous, Anxious, on Edge 2 3 3   Control/stop worrying 1 1 2   Worry too much - different things 2 2 2   Trouble relaxing 3 3 3   Restless 3 3 3   Easily annoyed or irritable 1 2 3   Afraid - awful might happen 0 0 0  Total GAD 7 Score 12 14 16   Anxiety Difficulty Not difficult at all Somewhat difficult Very difficult    Depression screen [facility] 2/9 04/02/2020 02/14/2020 09/20/2019  Decreased Interest 0 0 0  Down, Depressed, Hopeless 2 1 0  PHQ - 2 Score 2 1 0  Altered sleeping 3 3 1   Tired, decreased energy 1 3 0  Change in appetite 1 2 0  Feeling bad or failure about yourself  0 0 0  Trouble concentrating 3 3 0  Moving slowly or fidgety/restless 3 3 0  Suicidal thoughts 0 0 0  PHQ-9 Score 13 15 1   Difficult doing work/chores Somewhat difficult Somewhat difficult Not difficult at all  Some recent data might be hidden      [facility]- still going   An , doing college classes associates in and bachelors in finance Health issues with her daughter, difficulty in marriage with communication Buying a house No real change to stressors in life  Marriage ok, still performing well with work and school  She did an intake with [facility] attention specialists:  [facility]  [facility]s  Working currently 8-5 M-F  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  Vitamin D deficiency 09/20/2019  . Class 1 obesity with body mass index (BMI) of 32.0 to 32.9 in adult 09/20/2019  . Hyperlipidemia 09/20/2019  . Current mild episode of major depressive disorder (HCC) 09/20/2019  . Insomnia due to other mental disorder 09/20/2019  . S/P gastric bypass 09/20/2019  . B12 deficiency 09/20/2019  . Iron deficiency anemia following bariatric surgery 09/20/2019  . History of OCD (obsessive compulsive disorder) 08/30/2018  . Anxiety disorder 08/30/2018  . Hx of cold sores 08/30/2018  . Urticaria of unknown origin 02/13/2013    Social History   Tobacco Use  . Smoking status: Former Smoker    Types: Cigarettes    Quit date: 09/30/2009    Years since quitting: 10.5  . Smokeless tobacco: Never Used  Substance Use Topics  . Alcohol use: Yes    Comment: ocasional     Current Outpatient Medications:  .  cyanocobalamin (,VITAMIN B-12,) 1000 MCG/ML injection, INJECT 1 ML INTO THE SKIN INTRAMUSCULARLY ONCE EVERY MONTH, Disp: 1 mL, Rfl: 11 .  escitalopram (LEXAPRO) 10 MG tablet, Take 1.5 tablets (15 mg total) by mouth daily., Disp: 135 tablet, Rfl: 1  Allergies  Allergen Reactions  . Corn-Containing Products Hives  . Milk Protein Hives    I personally reviewed active problem list, medication list, allergies, family history, social history, health maintenance, notes from last encounter, lab results, imaging with the patient/caregiver today.   Review of Systems  Constitutional: Negative.   HENT: Negative.   Eyes: Negative.   Respiratory: Negative.   Cardiovascular: Negative.   Gastrointestinal: Negative.   Endocrine: Negative.   Genitourinary: Negative.   Musculoskeletal: Negative.   Skin: Negative.   Allergic/Immunologic: Negative.   Neurological: Negative.   Hematological: Negative.   Psychiatric/Behavioral: Negative.   All other systems reviewed and are negative.     Objective:   Virtual encounter, vitals limited, only able to obtain the  following Today's Vitals   04/02/20 1411  Weight: 156 lb 3.2 oz (70.9 kg)  Height: 4\' 11"  (1.499 m)   Body mass index is 31.55 kg/m. Nursing Note and Vital Signs reviewed.  Physical Exam Vitals and nursing note reviewed.  Constitutional:      General: She is not in acute distress.    Appearance: Normal appearance. She is normal weight. She is not toxic-appearing or diaphoretic.  HENT:     Head: Normocephalic and atraumatic.  Neurological:     Mental Status: She is alert.  Psychiatric:        Attention and Perception: Attention normal. She is attentive.        Mood and Affect: Mood is anxious. Mood is not depressed.        Speech: Speech normal.        Behavior: Behavior is hyperactive. Behavior is cooperative.        Thought Content: Thought content does not  include homicidal or suicidal ideation. Thought content does not include homicidal or suicidal plan.        Cognition and Memory: Cognition and memory normal.     Comments: Fidgety throughout encounter     PE limited by telephone encounter  No results found for this or any previous visit (from the past 72 hour(s)).  Assessment and Plan:     ICD-10-CM   1. Hyperactivity (behavior)  F90.9    and trouble concentrating - pending comprehensive intake testing with specialists clinic - she will see if they allow her to try meds prior to appt  2. History of OCD (obsessive compulsive disorder)  Z86.59    recommended she see psych for her past dx/CBT may be helpful for her moods, OCD hx, anxiety in addition to meds and addressing possible ADHD  3. Anxiety disorder, unspecified type  F41.9    still very stressed and anxious - she would like to continue lexapro 15 mg daily  4. Current mild episode of major depressive disorder, unspecified whether recurrent (HCC)  F32.0    mood about the same, she feels like she is less snappy, still stressed, anxious, aggitated, and having trouble concentrating - phq reviewed positive  5.  Insomnia due to other mental disorder  F51.05    F99    getting 6 to 8 hours of sleep each night - seems a little better     Willing to try low dose immediate release adderral trial while waiting for the rest of her assessment - encouraged pt to reach out and first see if this would be permissible - and let me know They advise no stimulant meds 7 d prior to their testing - she could stop/hold meds - but defer to [place] attention specialists preference -   If they prefer to not have [place] try any meds - would continue lexapro, and still recommend pt see psych/est with therapist for herself - OCD/anxiety/moods - since clinic will not address this and her OCD/anxiety likely overlap with ADHD sx and her moods significantly.  - I discussed the assessment and treatment plan with the patient. The patient was provided an opportunity to ask questions and all were answered. The patient agreed with the plan and demonstrated an understanding of the instructions.  I provided 20+ minutes of non-face-to-face time during this encounter.  Danelle Berry, PA-C 04/02/20 2:41 PM    04/02/20 6:11 PM pt did check with specialists clinic and sent f/up message that they are okay with immediate release meds if pt can d/c 7 d before her testing - low dose adderral sent into pharmacy  We had previously discussed dosing, SE, risk - watch for worsening mood/anxiety/insomnia

## 2020-04-22 ENCOUNTER — Encounter: Payer: Self-pay | Admitting: Family Medicine

## 2020-04-23 ENCOUNTER — Encounter: Payer: Self-pay | Admitting: Family Medicine

## 2020-04-23 ENCOUNTER — Other Ambulatory Visit: Payer: Self-pay

## 2020-04-23 ENCOUNTER — Ambulatory Visit: Payer: 59 | Admitting: Family Medicine

## 2020-04-23 VITALS — BP 110/82 | HR 97 | Temp 98.2°F | Resp 16 | Ht 59.0 in | Wt 161.4 lb

## 2020-04-23 DIAGNOSIS — L509 Urticaria, unspecified: Secondary | ICD-10-CM | POA: Diagnosis not present

## 2020-04-23 MED ORDER — PREDNISONE 20 MG PO TABS: 40.0000 mg | ORAL_TABLET | Freq: Every day | ORAL | 0 refills | Status: AC

## 2020-04-23 MED ORDER — HYDROXYZINE HCL 50 MG PO TABS: 50.0000 mg | ORAL_TABLET | Freq: Every evening | ORAL | 0 refills | Status: DC | PRN

## 2020-04-23 NOTE — Assessment & Plan Note (Addendum)
Exacerbation with hives, refractory to oral antihistamine. No red flags on history or exam to concern for tick-borne illness, malignancy, SJS syndrome. Will trial steroid burst. Has upcoming appt with allergist. F/u prn.

## 2020-04-23 NOTE — Progress Notes (Deleted)
Name: Sandra Munoz   MRN: [id number]    DOB: [birth year]   Date:04/23/2020       Progress Note  Subjective  Chief Complaint  Hives/Eye Swelling  HPI   Patient Active Problem List   Diagnosis Date Noted  . Vitamin D deficiency 09/20/2019  . Class 1 obesity with body mass index (BMI) of 32.0 to 32.9 in adult 09/20/2019  . Hyperlipidemia 09/20/2019  . Current mild episode of major depressive disorder (HCC) 09/20/2019  . Insomnia due to other mental disorder 09/20/2019  . S/P gastric bypass 09/20/2019  . B12 deficiency 09/20/2019  . Iron deficiency anemia following bariatric surgery 09/20/2019  . History of OCD (obsessive compulsive disorder) 08/30/2018  . Anxiety disorder 08/30/2018  . Hx of cold sores 08/30/2018  . Urticaria of unknown origin 02/13/2013    Past Surgical History:  Procedure Laterality Date  . ABDOMINAL HYSTERECTOMY    . GASTRIC BYPASS      Family History  Problem Relation Age of Onset  . Cancer Mother        SCC of skin/Involved throat  . Autoimmune disease Mother     Social History   Tobacco Use  . Smoking status: Former Smoker    Types: Cigarettes    Quit date: 09/30/2009    Years since quitting: 10.5  . Smokeless tobacco: Never Used  Substance Use Topics  . Alcohol use: Yes    Comment: ocasional     Current Outpatient Medications:  .  amphetamine-dextroamphetamine (ADDERALL) 10 MG tablet, Take 5 to 10 mg po daily prn in am with breakfast for attention/focus/hyperactivity, Disp: 30 tablet, Rfl: 0 .  cyanocobalamin (,VITAMIN B-12,) 1000 MCG/ML injection, INJECT 1 ML INTO THE SKIN INTRAMUSCULARLY ONCE EVERY MONTH, Disp: 1 mL, Rfl: 11 .  escitalopram (LEXAPRO) 10 MG tablet, Take 1.5 tablets (15 mg total) by mouth daily., Disp: 135 tablet, Rfl: 1  Allergies  Allergen Reactions  . Corn-Containing Products Hives  . Milk Protein Hives    I personally reviewed {Reviewed:14835} with the patient/caregiver today.   ROS  ***  Objective  There  were no vitals filed for this visit.  There is no height or weight on file to calculate BMI.  Physical Exam ***  Recent Results (from the past 2160 hour(s))  Iron and TIBC     Status: Abnormal   Collection Time: 02/06/20  1:15 PM  Result Value Ref Range   Iron 34 28 - 170 ug/dL   TIBC 010 272 - 536 ug/dL   Saturation Ratios 9 (L) 10.4 - 31.8 %   UIBC 359 ug/dL    Comment: Performed at [facility], [address] [place], [place] [address]  Ferritin     Status: None   Collection Time: 02/06/20  1:15 PM  Result Value Ref Range   Ferritin 18 11 - 307 ng/mL    Comment: Performed at [facility], [address] [place], [place] [address]  CBC with Differential     Status: Abnormal   Collection Time: 02/06/20  1:15 PM  Result Value Ref Range   WBC 5.6 4.0 - 10.5 K/uL   RBC 4.24 3.87 - 5.11 MIL/uL   Hemoglobin 12.3 12.0 - 15.0 g/dL   HCT 95.6 (L) 38.7 - 56.4 %   MCV 84.2 80.0 - 100.0 fL   MCH 29.0 26.0 - 34.0 pg   MCHC 34.5 30.0 - 36.0 g/dL   RDW 33.2 95.1 - 88.4 %   Platelets 287 150 -  400 K/uL   nRBC 0.0 0.0 - 0.2 %   Neutrophils Relative % 46 %   Neutro Abs 2.6 1.7 - 7.7 K/uL   Lymphocytes Relative 45 %   Lymphs Abs 2.5 0.7 - 4.0 K/uL   Monocytes Relative 6 %   Monocytes Absolute 0.4 0.1 - 1.0 K/uL   Eosinophils Relative 2 %   Eosinophils Absolute 0.1 0.0 - 0.5 K/uL   Basophils Relative 1 %   Basophils Absolute 0.0 0.0 - 0.1 K/uL   Immature Granulocytes 0 %   Abs Immature Granulocytes 0.01 0.00 - 0.07 K/uL    Comment: Performed at [facility], [address] [place], [place] [address]    Diabetic Foot Exam: Diabetic Foot Exam - Simple   No data filed    ***  PHQ2/9: Depression screen [facility] 2/9 04/02/2020 02/14/2020 09/20/2019 09/07/2019 05/09/2019  Decreased Interest 0 0 0 0 0  Down, Depressed, Hopeless 2 1 0 0 1  PHQ - 2 Score 2 1 0 0 1  Altered sleeping 3 3 1  0 0  Tired, decreased energy 1 3 0 0 0  Change in appetite 1 2 0 0 0   Feeling bad or failure about yourself  0 0 0 0 0  Trouble concentrating 3 3 0 0 0  Moving slowly or fidgety/restless 3 3 0 0 0  Suicidal thoughts 0 0 0 0 0  PHQ-9 Score 13 15 1  0 1  Difficult doing work/chores Somewhat difficult Somewhat difficult Not difficult at all Not difficult at all Not difficult at all  Some recent data might be hidden    phq 9 is {gen pos ***  Fall Risk: Fall Risk  04/02/2020 02/14/2020 09/20/2019 09/07/2019 05/09/2019  Falls in the past year? 0 0 0 0 0  Number falls in past yr: 0 0 0 0 0  Injury with Fall? 0 0 0 0 0  Follow up - Falls evaluation completed - - -   ***   Functional Status Survey:   ***   Assessment & Plan  *** There are no diagnoses linked to this encounter.

## 2020-04-23 NOTE — Progress Notes (Signed)
    SUBJECTIVE:   CHIEF COMPLAINT / HPI:   RASH Duration:  10 days  Location: generalized, top of feet, neck, trunk, arms, legs, face  Itching: yes Burning: no Redness: yes Oozing: no Scaling: no Blisters: no Painful: no Fevers: no Change in detergents/soaps/personal care products: no Recent illness: no Recent travel:no History of same: yes Context: worse Alleviating factors: xyzal helping some Treatments attempted:xyzal Shortness of breath: no  Throat/tongue swelling: no Myalgias/arthralgias: no   OBJECTIVE:   BP 110/82   Pulse 97   Temp 98.2 F (36.8 C) (Oral)   Resp 16   Ht 4\' 11"  (1.499 m)   Wt 161 lb 6.4 oz (73.2 kg)   SpO2 98%   BMI 32.60 kg/m   Gen: well appearing, in NAD Skin: scattered, diffuse hives noted to bilateral neck, base of scalp, arms, back without sloughing, bleeding, scale, discharge.  ASSESSMENT/PLAN:   Urticaria of unknown origin Exacerbation with hives, refractory to oral antihistamine. No red flags on history or exam to concern for tick-borne illness, malignancy, SJS syndrome. Will trial steroid burst. Has upcoming appt with allergist. F/u prn.    , DO

## 2020-04-23 NOTE — Patient Instructions (Signed)
It was great to see you!  Our plans for today:  - Take the steroids as prescribed, let us know if your rash doesn't get better with this. - Keep your appointment with your allergist.  Take care and seek immediate care sooner if you develop any concerns.   Dr. Linwood Dibbles

## 2020-04-24 ENCOUNTER — Ambulatory Visit: Payer: 59 | Admitting: Family Medicine

## 2020-05-09 ENCOUNTER — Ambulatory Visit
Admission: RE | Admit: 2020-05-09 | Discharge: 2020-05-09 | Disposition: A | Discharge status: Home / Self Care | Payer: 59 | Source: Ambulatory Visit | Attending: Family Medicine | Admitting: Family Medicine

## 2020-05-09 ENCOUNTER — Other Ambulatory Visit: Payer: Self-pay

## 2020-05-09 DIAGNOSIS — Z1231 Encounter for screening mammogram for malignant neoplasm of breast: Secondary | ICD-10-CM | POA: Insufficient documentation

## 2020-05-15 ENCOUNTER — Ambulatory Visit: Payer: 59 | Admitting: Family Medicine

## 2020-05-15 ENCOUNTER — Other Ambulatory Visit: Payer: Self-pay

## 2020-05-15 ENCOUNTER — Encounter: Payer: Self-pay | Admitting: Family Medicine

## 2020-05-15 VITALS — BP 120/72 | HR 92 | Temp 98.1°F | Resp 16 | Ht 59.0 in | Wt 167.7 lb

## 2020-05-15 DIAGNOSIS — D509 Iron deficiency anemia, unspecified: Secondary | ICD-10-CM

## 2020-05-15 DIAGNOSIS — Z9884 Bariatric surgery status: Secondary | ICD-10-CM

## 2020-05-15 DIAGNOSIS — L509 Urticaria, unspecified: Secondary | ICD-10-CM | POA: Diagnosis not present

## 2020-05-15 DIAGNOSIS — F909 Attention-deficit hyperactivity disorder, unspecified type: Secondary | ICD-10-CM

## 2020-05-15 DIAGNOSIS — R21 Rash and other nonspecific skin eruption: Secondary | ICD-10-CM | POA: Diagnosis not present

## 2020-05-15 DIAGNOSIS — E538 Deficiency of other specified B group vitamins: Secondary | ICD-10-CM

## 2020-05-15 MED ORDER — HYDROXYZINE PAMOATE 25 MG PO CAPS
25.0000 mg | ORAL_CAPSULE | Freq: Three times a day (TID) | ORAL | 2 refills | Status: DC | PRN
Start: 2020-05-15 — End: 2020-05-22

## 2020-05-15 MED ORDER — PREDNISONE 10 MG PO TABS: ORAL_TABLET | ORAL | 0 refills | Status: DC

## 2020-05-15 MED ORDER — MONTELUKAST SODIUM 10 MG PO TABS: 10.0000 mg | ORAL_TABLET | Freq: Every day | ORAL | 3 refills | Status: DC

## 2020-05-15 MED ORDER — FAMOTIDINE 20 MG PO TABS: 20.0000 mg | ORAL_TABLET | Freq: Two times a day (BID) | ORAL | 2 refills | Status: DC

## 2020-05-15 NOTE — Patient Instructions (Signed)
Hives Hives are itchy, red, swollen areas on your skin. Hives can show up on any part of your body. Hives often fade within 24 hours (acute hives). New hives can show up after old ones fade. This can go on for many days or weeks (chronic hives). Hives do not spread from person to person (are not contagious). Hives are caused by your body's response to something that you are allergic to (allergen). These are sometimes called triggers. You can get hives right after being around a trigger, or hours later. What are the causes?  Allergies to foods.  Insect bites or stings.  Pollen.  Pets.  Latex.  Chemicals.  Spending time in sunlight, heat, or cold.  Exercise.  Stress.  Some medicines.  Viruses. This includes the common cold.  Infections caused by germs (bacteria).  Allergy shots.  Blood transfusions. Sometimes, the cause is not known. What increases the risk?  Being a woman.  Being allergic to foods such as: ? Citrus fruits. ? Milk. ? Eggs. ? Peanuts. ? Tree nuts. ? Shellfish.  Being allergic to: ? Medicines. ? Latex. ? Insects. ? Animals. ? Pollen. What are the signs or symptoms?  Raised, itchy, red or white bumps or patches on your skin. These areas may: ? Get large and swollen. ? Change in shape and location. ? Stand alone or connect to each other over a large area of skin. ? Sting or hurt. ? Turn white when pressed in the center (blanch). In very bad cases, your hands, feet, and face may also get swollen. This may happen if hives start deeper in your skin.   How is this treated? Treatment for this condition depends on your symptoms. Treatment may include:  Using cool, wet cloths (cool compresses) or taking cool showers to stop the itching.  Medicines that help: ? Relieve itching (antihistamines). ? Reduce swelling (corticosteroids). ? Treat infection (antibiotics).  A medicine (omalizumab) that is given as a shot (injection). Your doctor may  prescribe this if you have hives that do not get better even after other treatments.  In very bad cases, you may need a shot of a medicine called epinephrine to prevent a life-threatening allergic reaction (anaphylaxis). Follow these instructions at home: Medicines  Take or apply over-the-counter and prescription medicines only as told by your doctor.  If you were prescribed an antibiotic medicine, use it as told by your doctor. Do not stop using it even if you start to feel better. Skin care  Apply cool, wet cloths to the hives.  Do not scratch your skin. Do not rub your skin. General instructions  Do not take hot showers or baths. This can make itching worse.  Do not wear tight clothes.  Use sunscreen and wear clothes that cover your skin when you are outside.  Avoid any triggers that cause your hives. Keep a journal to help track what causes your hives. Write down: ? What medicines you take. ? What you eat and drink. ? What products you use on your skin.  Keep all follow-up visits as told by your doctor. This is important. Contact a doctor if:  Your symptoms are not better with medicine.  Your joints hurt or are swollen. Get help right away if:  You have a fever.  You have pain in your belly (abdomen).  Your tongue or lips are swollen.  Your eyelids are swollen.  Your chest or throat feels tight.  You have trouble breathing or swallowing. These symptoms may be   an emergency. Do not wait to see if the symptoms will go away. Get medical help right away. Call your local emergency services (911 in the [place] Do not drive yourself to the hospital. Summary  Hives are itchy, red, swollen areas on your skin.  Treatment for this condition depends on your symptoms.  Avoid things that cause your hives. Keep a journal to help track what causes your hives.  Take and apply over-the-counter and prescription medicines only as told by your doctor.  Keep all follow-up visits  as told by your doctor. This is important. This information is not intended to replace advice given to you by your health care provider. Make sure you discuss any questions you have with your health care provider. Document Revised: 04/07/2019 Document Reviewed: 09/01/2017 Elsevier Patient Education  2021 Elsevier Inc.  

## 2020-05-15 NOTE — Telephone Encounter (Signed)
She still needs appt - we have never seen her for this and in order to help her we need appt to assess and treat. Please see if she wants to schedule today or she can go back to UC or telemed Steroids is not the best treatment for chronic or recurrent hives so I can't just call it in per her request - I would need to make sure that's what she needs and that she is on other meds she should be on that actually do treat hives thanks

## 2020-05-15 NOTE — Progress Notes (Signed)
Patient ID: Sandra Munoz, female    DOB: 02/06/1981, 40 y.o.   MRN: [id number]  PCP: Danelle Berry, PA-C  Chief Complaint  Patient presents with   Urticaria    Subjective:   Sandra Munoz is a 40 y.o. female, presents to clinic with CC of the following:  HPI  Hives/urticaria x 2 months She has seen MD in primary clinic and has seen allergist, avoiding dairy, gluten Allergy testing was negative She had online acute care appt and she was given about a 2-week course of prednisone taking 40 mg and decrease to 20 mg She is taking one 5 mg Xyzal daily, is not on any other antihistamine or allergy medications.  She gets hives diffusely to extremities, trunk, it was more severe this am, tends to be worse when she wakes up, exercises or gets hot She only one time had some rash to her face, no lip swelling, she denies any stridor, wheeze, difficulty breathing, a sensation of airway/throat closure  OV with Dr. Linwood Dibbles on 2/22:  RASH Duration:  10 days  Location: generalized, top of feet, neck, trunk, arms, legs, face  Itching: yes Burning: no Redness: yes Oozing: no Scaling: no Blisters: no Painful: no Fevers: no Change in detergents/soaps/personal care products: no Recent illness: no Recent travel:no History of same: yes Context: worse Alleviating factors: xyzal helping some Treatments attempted:xyzal Shortness of breath: no  Throat/tongue swelling: no Myalgias/arthralgias: no  She had a change to meds with new ADD med - intuniv, but rash proceeds new med  She has seen allergist at [facility] They suggested that she may need to see endocrinology next She has also seen Dermatology and 2 biopsies were taken and there were no pertinent findings on bx  Hx of anemia, multiple deficiencies d/t s/p gastric bypass and malabsorption - referred [facility] hematology at [facility] -  Last OV with Dr. Leonard Schwartz reviewed: Sandra Munoz 40 y.o.  female anemia secondary to B12 deficiency/iron  malabsorption from gastric bypass is here for follow-up.  Patient energy levels improved s/p IV iron infusions.  However the last few months noted to have progressive fatigue.  Complains of myalgias.  Hemoglobin  Date Value Ref Range Status  02/06/2020 12.3 12.0 - 15.0 g/dL Final  76/28/3151 76.1 (L) 12.0 - 15.0 g/dL Final  60/73/7106 26.9 (L) 12.0 - 15.0 g/dL Final  48/54/6270 8.6 (L) 11.7 - 15.5 g/dL Final  H/H improving Lab Results  Component Value Date   IRON 34 02/06/2020   TIBC 393 02/06/2020   FERRITIN 18 02/06/2020   Last vitamin D Lab Results  Component Value Date   VD25OH 24 (L) 09/09/2018   Lab Results  Component Value Date   VITAMINB12 >2,000 (H) 09/20/2019   Lab Results  Component Value Date   TSH 1.08 09/20/2019       Patient Active Problem List   Diagnosis Date Noted   Vitamin D deficiency 09/20/2019   Class 1 obesity with body mass index (BMI) of 32.0 to 32.9 in adult 09/20/2019   Hyperlipidemia 09/20/2019   Current mild episode of major depressive disorder (HCC) 09/20/2019   Insomnia due to other mental disorder 09/20/2019   S/P gastric bypass 09/20/2019   B12 deficiency 09/20/2019   Iron deficiency anemia following bariatric surgery 09/20/2019   History of OCD (obsessive compulsive disorder) 08/30/2018   Anxiety disorder 08/30/2018   Hx of cold sores 08/30/2018   Urticaria of unknown origin 02/13/2013      Current Outpatient Medications:  cyanocobalamin (,VITAMIN B-12,) 1000 MCG/ML injection, INJECT 1 ML INTO THE SKIN INTRAMUSCULARLY ONCE EVERY MONTH, Disp: 1 mL, Rfl: 11   escitalopram (LEXAPRO) 10 MG tablet, Take 1.5 tablets (15 mg total) by mouth daily., Disp: 135 tablet, Rfl: 1   guanFACINE (INTUNIV) 1 MG TB24 ER tablet, Take 1 mg by mouth daily., Disp: , Rfl:    hydrOXYzine (ATARAX/VISTARIL) 50 MG tablet, Take 1 tablet (50 mg total) by mouth at bedtime as needed., Disp: 30 tablet, Rfl: 0   Allergies  Allergen  Reactions   Corn-Containing Products Hives   Milk Protein Hives     Social History   Tobacco Use   Smoking status: Former Smoker    Types: Cigarettes    Quit date: 09/30/2009    Years since quitting: 10.6   Smokeless tobacco: Never Used  Vaping Use   Vaping Use: Never used  Substance Use Topics   Alcohol use: Yes    Comment: ocasional   Drug use: Never      Chart Review Today: I personally reviewed active problem list, medication list, allergies, family history, social history, health maintenance, notes from last encounter, lab results, imaging with the patient/caregiver today.   Review of Systems  Constitutional: Negative.   HENT: Negative.   Eyes: Negative.   Respiratory: Negative.  Negative for cough, choking, chest tightness, shortness of breath, wheezing and stridor.   Cardiovascular: Negative.  Negative for palpitations.  Gastrointestinal: Negative.   Endocrine: Negative.   Genitourinary: Negative.   Musculoskeletal: Negative.  Negative for arthralgias, gait problem, joint swelling and myalgias.  Skin: Positive for rash. Negative for color change, pallor and wound.  Allergic/Immunologic: Negative.   Neurological: Negative.   Hematological: Negative.   Psychiatric/Behavioral: Negative for dysphoric mood and suicidal ideas. The patient is hyperactive.   All other systems reviewed and are negative.      Objective:   Vitals:   05/15/20 1122  BP: 120/72  Pulse: 92  Resp: 16  Temp: 98.1 F (36.7 C)  SpO2: 95%  Weight: 167 lb 11.2 oz (76.1 kg)  Height: 4\' 11"  (1.499 m)    Body mass index is 33.87 kg/m.  Physical Exam Vitals and nursing note reviewed.  Constitutional:      General: She is not in acute distress.    Appearance: Normal appearance. She is well-developed. She is not ill-appearing, toxic-appearing or diaphoretic.     Interventions: Face mask in place.  HENT:     Head: Normocephalic and atraumatic.     Right Ear: Hearing and external  ear normal.     Left Ear: Hearing and external ear normal.     Nose: Mucosal edema, congestion and rhinorrhea present.     Right Sinus: No maxillary sinus tenderness or frontal sinus tenderness.     Left Sinus: No maxillary sinus tenderness or frontal sinus tenderness.     Mouth/Throat:     Mouth: Mucous membranes are moist. Mucous membranes are not pale.     Pharynx: Uvula midline. Posterior oropharyngeal erythema present. No oropharyngeal exudate or uvula swelling.     Tonsils: No tonsillar abscesses.  Eyes:     General: Lids are normal. No scleral icterus.       Right eye: No discharge.        Left eye: No discharge.     Conjunctiva/sclera: Conjunctivae normal.  Neck:     Trachea: Trachea and phonation normal. No tracheal deviation.  Cardiovascular:     Rate and Rhythm: Normal rate and  regular rhythm.     Pulses: Normal pulses.          Radial pulses are 2+ on the right side and 2+ on the left side.       Posterior tibial pulses are 2+ on the right side and 2+ on the left side.     Heart sounds: Normal heart sounds. No murmur heard. No friction rub. No gallop.   Pulmonary:     Effort: Pulmonary effort is normal. No respiratory distress.     Breath sounds: Normal breath sounds. No stridor. No wheezing, rhonchi or rales.  Chest:     Chest wall: No tenderness.  Abdominal:     General: There is no distension.  Musculoskeletal:     Right lower leg: No edema.     Left lower leg: No edema.  Skin:    General: Skin is warm and dry.     Coloration: Skin is not jaundiced or pale.     Findings: Rash present. No bruising or lesion.  Neurological:     Mental Status: She is alert. Mental status is at baseline.     Motor: No abnormal muscle tone.     Coordination: Coordination normal.     Gait: Gait normal.  Psychiatric:        Mood and Affect: Mood normal.        Speech: Speech normal.        Behavior: Behavior normal.      Results for orders placed or performed in visit on  02/06/20  Iron and TIBC  Result Value Ref Range   Iron 34 28 - 170 ug/dL   TIBC 696393 295250 - 284450 ug/dL   Saturation Ratios 9 (L) 10.4 - 31.8 %   UIBC 359 ug/dL  Ferritin  Result Value Ref Range   Ferritin 18 11 - 307 ng/mL  CBC with Differential  Result Value Ref Range   WBC 5.6 4.0 - 10.5 K/uL   RBC 4.24 3.87 - 5.11 MIL/uL   Hemoglobin 12.3 12.0 - 15.0 g/dL   HCT 13.235.7 (L) 44.036.0 - 10.246.0 %   MCV 84.2 80.0 - 100.0 fL   MCH 29.0 26.0 - 34.0 pg   MCHC 34.5 30.0 - 36.0 g/dL   RDW 72.513.2 36.611.5 - 44.015.5 %   Platelets 287 150 - 400 K/uL   nRBC 0.0 0.0 - 0.2 %   Neutrophils Relative % 46 %   Neutro Abs 2.6 1.7 - 7.7 K/uL   Lymphocytes Relative 45 %   Lymphs Abs 2.5 0.7 - 4.0 K/uL   Monocytes Relative 6 %   Monocytes Absolute 0.4 0.1 - 1.0 K/uL   Eosinophils Relative 2 %   Eosinophils Absolute 0.1 0.0 - 0.5 K/uL   Basophils Relative 1 %   Basophils Absolute 0.0 0.0 - 0.1 K/uL   Immature Granulocytes 0 %   Abs Immature Granulocytes 0.01 0.00 - 0.07 K/uL       Assessment & Plan:     ICD-10-CM   1. Urticaria of unknown origin  L50.9 hydrOXYzine (VISTARIL) 25 MG capsule    famotidine (PEPCID) 20 MG tablet    predniSONE (DELTASONE) 10 MG tablet    montelukast (SINGULAIR) 10 MG tablet    CBC with Differential/Platelet    TSH    T4, free   recheck basic labs - add additional 2nd gen antihistamine at night, cont xyzal in am, add pepcid bid, hydroxyzine 25 mt TID PRN  2. Rash  R21 hydrOXYzine (VISTARIL)  25 MG capsule    famotidine (PEPCID) 20 MG tablet    predniSONE (DELTASONE) 10 MG tablet    montelukast (SINGULAIR) 10 MG tablet    CBC with Differential/Platelet    TSH    T4, free   if rash does not improve suggested pt try adding singulair as well.  she wants more steroids, I will continue pred taper down, r/o other causes  3. Hyperactivity (behavior)  F90.9 guanFACINE (INTUNIV) 1 MG TB24 ER tablet    TSH   seen by specialists, starting meds, seems to be helping  4. Iron deficiency  anemia, unspecified iron deficiency anemia type  D50.9 CBC with Differential/Platelet   H/H reviewed - improving, managed by hematology/Dr. B, labs and last OV reviewed  5. B12 deficiency  E53.8    last B12 was high  6. S/P gastric bypass  Z98.84    hx of malabsorption and anemia - recheck basic labs    Plan - continue diet/avoiding food allergies/triggers Avoid hot showers and cool down when able - heat seems to trigger (mast cell activation?) Continue xyzal in am Add another 2nd gen antihistamine at bedtime such as Claritin Zyrtec or Allegra Start Pepcid 20 mg twice daily for the next couple weeks She requested hydroxyzine be given in a capsule form -sent in new hydroxyzine 25 mg to take 25 to 50 mg up to 3 times daily as needed for uncontrolled or worsening rash itching or hives She did a 40 mg prednisone dose, down to 20 mg -she took this for over 10 days, her last 20 mg prednisone dose was yesterday and she woke up with worsening hives today.  Will continue taper with 10 mg of prednisone for 5 days and then decrease to 5 mg for 5d  We will see if new combination of second-generation antihistamines and Pepcid as a H2 blocker will help get control of her recurrent hives Encouraged her to follow-up with me in about 2 weeks let me know how she is doing  I discussed black box warning on montelukast but with her current symptoms and history related to rash it may be helpful to add this as well -encouraged her to watch for mood changes or worsening anxiety   Patient was hemodynamically intact her airway was patent she did have some edema and erythema of both her nasal mucosa and her posterior oropharynx.  Info given on Hives and plan Pending her response - we can refer to new specialists  I cannot see any notes from allergist -I am not sure why they would want her to see a endocrinologist the patient did mention that they would concern it would be related to an autoimmune disease.  Does seem  to be systemic, but with negative biopsy already of rash lesions it would be unusual for them to not help contribute to an autoimmune disease or diagnosis?     Danelle Berry, PA-C 05/15/20 11:42 AM

## 2020-05-20 NOTE — Addendum Note (Signed)
Addended by: Davene Costain on: 05/20/2020 03:36 PM   Modules accepted: Orders

## 2020-05-22 ENCOUNTER — Other Ambulatory Visit: Payer: Self-pay | Admitting: Family Medicine

## 2020-05-22 DIAGNOSIS — R21 Rash and other nonspecific skin eruption: Secondary | ICD-10-CM

## 2020-05-22 DIAGNOSIS — L509 Urticaria, unspecified: Secondary | ICD-10-CM

## 2020-05-22 LAB — CBC WITH DIFFERENTIAL/PLATELET
Basophils Absolute: 0 10*3/uL (ref 0.0–0.2)
Basos: 0 %
EOS (ABSOLUTE): 0.1 10*3/uL (ref 0.0–0.4)
Eos: 1 %
Hematocrit: 39.9 % (ref 34.0–46.6)
Hemoglobin: 13.2 g/dL (ref 11.1–15.9)
Immature Grans (Abs): 0.1 10*3/uL (ref 0.0–0.1)
Immature Granulocytes: 1 %
Lymphocytes Absolute: 1.8 10*3/uL (ref 0.7–3.1)
Lymphs: 21 %
MCH: 29.7 pg (ref 26.6–33.0)
MCHC: 33.1 g/dL (ref 31.5–35.7)
MCV: 90 fL (ref 79–97)
Monocytes Absolute: 0.3 10*3/uL (ref 0.1–0.9)
Monocytes: 3 %
Neutrophils Absolute: 6.4 10*3/uL (ref 1.4–7.0)
Neutrophils: 74 %
Platelets: 307 10*3/uL (ref 150–450)
RBC: 4.45 x10E6/uL (ref 3.77–5.28)
RDW: 12.1 % (ref 11.7–15.4)
WBC: 8.6 10*3/uL (ref 3.4–10.8)

## 2020-05-22 LAB — TSH: TSH: 0.411 u[IU]/mL — ABNORMAL LOW (ref 0.450–4.500)

## 2020-05-22 LAB — T4, FREE: Free T4: 0.93 ng/dL (ref 0.82–1.77)

## 2020-05-22 NOTE — Telephone Encounter (Deleted)
This was sent in as a 90 day prescription at three pills a day.

## 2020-06-03 ENCOUNTER — Encounter: Payer: Self-pay | Admitting: Family Medicine

## 2020-06-03 DIAGNOSIS — R7989 Other specified abnormal findings of blood chemistry: Secondary | ICD-10-CM

## 2020-06-19 ENCOUNTER — Other Ambulatory Visit: Payer: Self-pay | Admitting: Family Medicine

## 2020-06-19 DIAGNOSIS — R21 Rash and other nonspecific skin eruption: Secondary | ICD-10-CM

## 2020-06-19 DIAGNOSIS — L509 Urticaria, unspecified: Secondary | ICD-10-CM

## 2020-07-23 ENCOUNTER — Other Ambulatory Visit: Payer: Self-pay | Admitting: Family Medicine

## 2020-07-23 DIAGNOSIS — L509 Urticaria, unspecified: Secondary | ICD-10-CM

## 2020-07-23 DIAGNOSIS — R21 Rash and other nonspecific skin eruption: Secondary | ICD-10-CM

## 2020-08-02 ENCOUNTER — Inpatient Hospital Stay: Payer: 59 | Attending: Internal Medicine

## 2020-08-02 ENCOUNTER — Other Ambulatory Visit: Payer: Self-pay

## 2020-08-02 ENCOUNTER — Other Ambulatory Visit: Payer: Self-pay | Admitting: Internal Medicine

## 2020-08-02 DIAGNOSIS — Z9884 Bariatric surgery status: Secondary | ICD-10-CM | POA: Diagnosis not present

## 2020-08-02 DIAGNOSIS — K909 Intestinal malabsorption, unspecified: Secondary | ICD-10-CM | POA: Diagnosis not present

## 2020-08-02 DIAGNOSIS — D508 Other iron deficiency anemias: Secondary | ICD-10-CM

## 2020-08-02 DIAGNOSIS — R5383 Other fatigue: Secondary | ICD-10-CM | POA: Diagnosis not present

## 2020-08-02 DIAGNOSIS — E538 Deficiency of other specified B group vitamins: Secondary | ICD-10-CM | POA: Insufficient documentation

## 2020-08-02 DIAGNOSIS — D509 Iron deficiency anemia, unspecified: Secondary | ICD-10-CM | POA: Insufficient documentation

## 2020-08-02 DIAGNOSIS — Z79899 Other long term (current) drug therapy: Secondary | ICD-10-CM | POA: Insufficient documentation

## 2020-08-02 DIAGNOSIS — K9589 Other complications of other bariatric procedure: Secondary | ICD-10-CM

## 2020-08-02 DIAGNOSIS — Z803 Family history of malignant neoplasm of breast: Secondary | ICD-10-CM | POA: Insufficient documentation

## 2020-08-02 DIAGNOSIS — R531 Weakness: Secondary | ICD-10-CM | POA: Diagnosis not present

## 2020-08-02 LAB — CBC WITH DIFFERENTIAL/PLATELET
Abs Immature Granulocytes: 0.01 10*3/uL (ref 0.00–0.07)
Basophils Absolute: 0 10*3/uL (ref 0.0–0.1)
Basophils Relative: 0 %
Eosinophils Absolute: 0.1 10*3/uL (ref 0.0–0.5)
Eosinophils Relative: 1 %
HCT: 36.3 % (ref 36.0–46.0)
Hemoglobin: 12.3 g/dL (ref 12.0–15.0)
Immature Granulocytes: 0 %
Lymphocytes Relative: 34 %
Lymphs Abs: 2.3 10*3/uL (ref 0.7–4.0)
MCH: 28.9 pg (ref 26.0–34.0)
MCHC: 33.9 g/dL (ref 30.0–36.0)
MCV: 85.2 fL (ref 80.0–100.0)
Monocytes Absolute: 0.4 10*3/uL (ref 0.1–1.0)
Monocytes Relative: 6 %
Neutro Abs: 4 10*3/uL (ref 1.7–7.7)
Neutrophils Relative %: 59 %
Platelets: 265 10*3/uL (ref 150–400)
RBC: 4.26 MIL/uL (ref 3.87–5.11)
RDW: 12.5 % (ref 11.5–15.5)
WBC: 6.9 10*3/uL (ref 4.0–10.5)
nRBC: 0 % (ref 0.0–0.2)

## 2020-08-02 LAB — COMPREHENSIVE METABOLIC PANEL
ALT: 17 U/L (ref 0–44)
AST: 20 U/L (ref 15–41)
Albumin: 3.7 g/dL (ref 3.5–5.0)
Alkaline Phosphatase: 35 U/L — ABNORMAL LOW (ref 38–126)
Anion gap: 4 — ABNORMAL LOW (ref 5–15)
BUN: 15 mg/dL (ref 6–20)
CO2: 26 mmol/L (ref 22–32)
Calcium: 8.5 mg/dL — ABNORMAL LOW (ref 8.9–10.3)
Chloride: 108 mmol/L (ref 98–111)
Creatinine, Ser: 0.79 mg/dL (ref 0.44–1.00)
GFR, Estimated: >60 mL/min (ref >60)
Glucose, Bld: 103 mg/dL — ABNORMAL HIGH (ref 70–99)
Potassium: 4.4 mmol/L (ref 3.5–5.1)
Sodium: 138 mmol/L (ref 135–145)
Total Bilirubin: 0.5 mg/dL (ref 0.3–1.2)
Total Protein: 6.5 g/dL (ref 6.5–8.1)

## 2020-08-02 LAB — FERRITIN: Ferritin: 14 ng/mL (ref 11–307)

## 2020-08-02 LAB — IRON AND TIBC
Iron: 42 ug/dL (ref 28–170)
Saturation Ratios: 10 % — ABNORMAL LOW (ref 10.4–31.8)
TIBC: 421 ug/dL (ref 250–450)
UIBC: 379 ug/dL

## 2020-08-06 ENCOUNTER — Ambulatory Visit: Payer: 59 | Admitting: Internal Medicine

## 2020-08-06 ENCOUNTER — Other Ambulatory Visit: Payer: 59

## 2020-08-06 ENCOUNTER — Ambulatory Visit: Payer: 59

## 2020-08-06 ENCOUNTER — Inpatient Hospital Stay: Payer: 59

## 2020-08-06 ENCOUNTER — Other Ambulatory Visit: Payer: Self-pay | Admitting: *Deleted

## 2020-08-06 DIAGNOSIS — K9589 Other complications of other bariatric procedure: Secondary | ICD-10-CM

## 2020-08-06 DIAGNOSIS — D508 Other iron deficiency anemias: Secondary | ICD-10-CM

## 2020-08-07 ENCOUNTER — Inpatient Hospital Stay: Payer: 59

## 2020-08-07 ENCOUNTER — Inpatient Hospital Stay (HOSPITAL_BASED_OUTPATIENT_CLINIC_OR_DEPARTMENT_OTHER): Payer: 59 | Admitting: Internal Medicine

## 2020-08-07 ENCOUNTER — Encounter: Payer: Self-pay | Admitting: Internal Medicine

## 2020-08-07 DIAGNOSIS — D508 Other iron deficiency anemias: Secondary | ICD-10-CM | POA: Diagnosis not present

## 2020-08-07 DIAGNOSIS — K9589 Other complications of other bariatric procedure: Secondary | ICD-10-CM

## 2020-08-07 DIAGNOSIS — D509 Iron deficiency anemia, unspecified: Secondary | ICD-10-CM | POA: Diagnosis not present

## 2020-08-07 MED ORDER — SODIUM CHLORIDE 0.9 % IV SOLN
Freq: Once | INTRAVENOUS | Status: AC
  Filled 2020-08-07: qty 250

## 2020-08-07 MED ORDER — SODIUM CHLORIDE 0.9 % IV SOLN: 200.0000 mg | Freq: Once | INTRAVENOUS | Status: DC

## 2020-08-07 MED ORDER — IRON SUCROSE 20 MG/ML IV SOLN
200.0000 mg | Freq: Once | INTRAVENOUS | Status: AC
Start: 2020-08-07 — End: 2020-08-07
  Administered 2020-08-07: 200 mg via INTRAVENOUS
  Filled 2020-08-07: qty 10

## 2020-08-07 NOTE — Patient Instructions (Signed)

## 2020-08-07 NOTE — Progress Notes (Signed)
[facility] CONSULT NOTE  Patient Care Team: Danelle Berry, PA-C as PCP - General (Family Medicine)  CHIEF COMPLAINTS/PURPOSE OF CONSULTATION:    HEMATOLOGY HISTORY:   # ANEMIA [sep 2016- gastric bypass; [place]; > 100 pounds]   # TAH [fibroid; Ovaries intact- 2019]; EGD/colonoscopy- 2016/ at gastric bypass  HISTORY OF PRESENTING ILLNESS:  Sandra Munoz 40 y.o.  female anemia secondary to B12 deficiency/iron malabsorption from gastric bypass is here for follow-up.  Patient complains of mild fatigue.  Denies any blood in stools or black-colored stools.   Review of Systems  Constitutional:  Positive for malaise/fatigue. Negative for chills, diaphoresis, fever and weight loss.  HENT:  Negative for nosebleeds and sore throat.   Eyes:  Negative for double vision.  Respiratory:  Negative for cough, hemoptysis, sputum production and wheezing.   Cardiovascular:  Negative for chest pain, palpitations, orthopnea and leg swelling.  Gastrointestinal:  Negative for abdominal pain, blood in stool, constipation, diarrhea, heartburn, melena, nausea and vomiting.  Genitourinary:  Negative for dysuria, frequency and urgency.  Musculoskeletal:  Positive for myalgias. Negative for back pain and joint pain.  Skin: Negative.  Negative for itching and rash.  Neurological:  Negative for dizziness, tingling, focal weakness, weakness and headaches.  Endo/Heme/Allergies:  Does not bruise/bleed easily.  Psychiatric/Behavioral:  Negative for depression. The patient is not nervous/anxious and does not have insomnia.    MEDICAL HISTORY:  Past Medical History:  Diagnosis Date   Allergy    Depression    Polycystic ovarian syndrome     SURGICAL HISTORY: Past Surgical History:  Procedure Laterality Date   ABDOMINAL HYSTERECTOMY     GASTRIC BYPASS     REDUCTION MAMMAPLASTY Bilateral 2002    SOCIAL HISTORY: Social History   Socioeconomic History   Marital status: Married     Spouse name: Cristal Deer   Number of children: 1   Years of education: Not on file   Highest education level: Not on file  Occupational History   Not on file  Tobacco Use   Smoking status: Former    Pack years: 0.00    Types: Cigarettes    Quit date: 09/30/2009    Years since quitting: 10.8   Smokeless tobacco: Never  Vaping Use   Vaping Use: Never used  Substance and Sexual Activity   Alcohol use: Yes    Comment: ocasional   Drug use: Never   Sexual activity: Yes    Birth control/protection: Surgical  Other Topics Concern   Not on file  Social History Narrative   Moved from [place]; Accountant; 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    Social Determinants of Health   Financial Resource Strain: Not on file  Food Insecurity: Not on file  Transportation Needs: Not on file  Physical Activity: Not on file  Stress: Not on file  Social Connections: Not on file  Intimate Partner Violence: Not on file    FAMILY HISTORY: Family History  Problem Relation Age of Onset   Cancer Mother        SCC of skin/Involved throat   Autoimmune disease Mother    Breast cancer Neg Hx     ALLERGIES:  is allergic to corn-containing products and milk protein.  MEDICATIONS:  Current Outpatient Medications  Medication Sig Dispense Refill   cyanocobalamin (,VITAMIN B-12,) 1000 MCG/ML injection INJECT 1 ML INTO THE SKIN INTRAMUSCULARLY ONCE EVERY MONTH 1 mL 11   escitalopram (LEXAPRO) 10 MG tablet Take 1.5 tablets (15 mg  total) by mouth daily. 135 tablet 1   famotidine (PEPCID) 20 MG tablet Take 1 tablet (20 mg total) by mouth 2 (two) times daily. 60 tablet 2   guanFACINE (INTUNIV) 2 MG TB24 ER tablet Take 2 mg by mouth daily.     hydrOXYzine (VISTARIL) 25 MG capsule TAKE 1-2 CAPSULES (25-50 MG TOTAL) BY MOUTH 3 (THREE) TIMES DAILY AS NEEDED FOR ITCHING (HIVES). 180 capsule 1   levocetirizine (XYZAL) 5 MG tablet      montelukast (SINGULAIR) 10 MG tablet Take 1 tablet (10 mg  total) by mouth at bedtime. 30 tablet 3   No current facility-administered medications for this visit.      PHYSICAL EXAMINATION:   Vitals:   08/07/20 1454  BP: 109/69  Pulse: 67  Resp: 16  Temp: 99.1 F (37.3 C)  SpO2: 99%   Filed Weights   08/07/20 1454  Weight: 180 lb (81.6 kg)    Physical Exam HENT:     Head: Normocephalic and atraumatic.     Mouth/Throat:     Pharynx: No oropharyngeal exudate.  Eyes:     Pupils: Pupils are equal, round, and reactive to light.  Cardiovascular:     Rate and Rhythm: Normal rate and regular rhythm.  Pulmonary:     Effort: Pulmonary effort is normal. No respiratory distress.     Breath sounds: Normal breath sounds. No wheezing.  Abdominal:     General: Bowel sounds are normal. There is no distension.     Palpations: Abdomen is soft. There is no mass.     Tenderness: no abdominal tenderness There is no guarding or rebound.  Musculoskeletal:        General: No tenderness. Normal range of motion.     Cervical back: Normal range of motion and neck supple.  Skin:    General: Skin is warm.  Neurological:     Mental Status: She is alert and oriented to person, place, and time.  Psychiatric:        Mood and Affect: Affect normal.    LABORATORY DATA:  I have reviewed the data as listed Lab Results  Component Value Date   WBC 6.9 08/02/2020   HGB 12.3 08/02/2020   HCT 36.3 08/02/2020   MCV 85.2 08/02/2020   PLT 265 08/02/2020   Recent Labs    09/20/19 1529 08/02/20 0758  NA 138 138  K 4.1 4.4  CL 108 108  CO2 26 26  GLUCOSE 75 103*  BUN 10 15  CREATININE 0.67 0.79  CALCIUM 8.6 8.5*  GFRNONAA 110 >60  GFRAA 127  --   PROT 6.6 6.5  ALBUMIN  --  3.7  AST 19 20  ALT 13 17  ALKPHOS  --  35*  BILITOT 0.2 0.5     No results found.  Iron deficiency anemia following bariatric surgery #Iron deficiency secondary gastric bypass-anemia 8.3; microcytic.;  Iron saturation 3% ferritin 3.-S/p Venofer weekly x4  hemoglobin-12.2. pt symptomatic [cold/fatigue]. Proceed with venofer.   #B12-deficiency on B12 injections PCP/home  # DISPOSITION: # Venofer today # follow up in 6 months- MD;3-4 days PRIOR- labs- cbc/bmp;iron studies/ferritin- Possible Venofer-Dr.B .  All questions were answered. The patient knows to call the clinic with any problems, questions or concerns.     Earna Coder, MD 08/18/2020 6:51 PM

## 2020-08-07 NOTE — Assessment & Plan Note (Signed)
#  Iron deficiency secondary gastric bypass-anemia 8.3; microcytic.;  Iron saturation 3% ferritin 3.-S/p Venofer weekly x4 hemoglobin-12.2. pt symptomatic [cold/fatigue]. Proceed with venofer.   #B12-deficiency on B12 injections PCP/home  # DISPOSITION: # Venofer today # follow up in 6 months- MD;3-4 days PRIOR- labs- cbc/bmp;iron studies/ferritin- Possible Venofer-Dr.B .

## 2020-08-18 ENCOUNTER — Encounter: Payer: Self-pay | Admitting: Internal Medicine

## 2020-08-18 ENCOUNTER — Other Ambulatory Visit: Payer: Self-pay | Admitting: Family Medicine

## 2020-08-18 DIAGNOSIS — L509 Urticaria, unspecified: Secondary | ICD-10-CM

## 2020-08-18 DIAGNOSIS — R21 Rash and other nonspecific skin eruption: Secondary | ICD-10-CM

## 2020-08-19 ENCOUNTER — Other Ambulatory Visit: Payer: Self-pay

## 2020-09-09 ENCOUNTER — Encounter: Payer: Self-pay | Admitting: Family Medicine

## 2020-09-09 NOTE — Telephone Encounter (Signed)
You will be seeing her for her physical - do you want to do labs that you would normally do?   Or do you want me to handle even though I won't be the one seeing her. I would need a discussion with the pt about hair/hormones etc - before ordering anything.  And these wouldn't all be done with CPE since they are problems and not just wellness/screening - so let me know if you want to address or if you want me to  Thanks [facility]

## 2020-09-10 ENCOUNTER — Other Ambulatory Visit: Payer: Self-pay | Admitting: Family Medicine

## 2020-09-10 DIAGNOSIS — E669 Obesity, unspecified: Secondary | ICD-10-CM

## 2020-09-10 DIAGNOSIS — Z6832 Body mass index (BMI) 32.0-32.9, adult: Secondary | ICD-10-CM

## 2020-09-24 ENCOUNTER — Encounter: Payer: Self-pay | Admitting: Family Medicine

## 2020-09-24 ENCOUNTER — Ambulatory Visit (INDEPENDENT_AMBULATORY_CARE_PROVIDER_SITE_OTHER): Payer: 59 | Admitting: Family Medicine

## 2020-09-24 ENCOUNTER — Other Ambulatory Visit: Payer: Self-pay

## 2020-09-24 VITALS — BP 122/74 | HR 85 | Temp 98.1°F | Resp 16 | Ht 59.0 in | Wt 184.0 lb

## 2020-09-24 DIAGNOSIS — F419 Anxiety disorder, unspecified: Secondary | ICD-10-CM

## 2020-09-24 DIAGNOSIS — F320 Major depressive disorder, single episode, mild: Secondary | ICD-10-CM | POA: Diagnosis not present

## 2020-09-24 DIAGNOSIS — E669 Obesity, unspecified: Secondary | ICD-10-CM

## 2020-09-24 DIAGNOSIS — E538 Deficiency of other specified B group vitamins: Secondary | ICD-10-CM

## 2020-09-24 DIAGNOSIS — K9589 Other complications of other bariatric procedure: Secondary | ICD-10-CM

## 2020-09-24 DIAGNOSIS — E785 Hyperlipidemia, unspecified: Secondary | ICD-10-CM

## 2020-09-24 DIAGNOSIS — Z6837 Body mass index (BMI) 37.0-37.9, adult: Secondary | ICD-10-CM

## 2020-09-24 DIAGNOSIS — D508 Other iron deficiency anemias: Secondary | ICD-10-CM

## 2020-09-24 MED ORDER — ESCITALOPRAM OXALATE 10 MG PO TABS: 10.0000 mg | ORAL_TABLET | Freq: Every day | ORAL | 1 refills | Status: DC

## 2020-09-24 NOTE — Assessment & Plan Note (Signed)
Receiving iron infusions, last Hb wnl. Continue following with Heme/Onc.

## 2020-09-24 NOTE — Patient Instructions (Addendum)
It was great to see you!  Our plans for today:  - Take 57m of the lexapro. - Keep working on staying active and eating a healthy diet.  - Come back in 2-3 months for follow up  We are checking some labs today, we will release these results to your MyChart.  Take care and seek immediate care sooner if you develop any concerns.   Dr. RKy Barban  Preventive Care [age]Years Old, Female Preventive care refers to lifestyle choices and visits with your health care provider that can promote health and wellness. This includes: A yearly physical exam. This is also called an annual wellness visit. Regular dental and eye exams. Immunizations. Screening for certain conditions. Healthy lifestyle choices, such as: Eating a healthy diet. Getting regular exercise. Not using drugs or products that contain nicotine and tobacco. Limiting alcohol use. What can I expect for my preventive care visit? Physical exam Your health care provider will check your: Height and weight. These may be used to calculate your BMI (body mass index). BMI is a measurement that tells if you are at a healthy weight. Heart rate and blood pressure. Body temperature. Skin for abnormal spots. Counseling Your health care provider may ask you questions about your: Past medical problems. Family's medical history. Alcohol, tobacco, and drug use. Emotional well-being. Home life and relationship well-being. Sexual activity. Diet, exercise, and sleep habits. Work and work eStatistician Access to firearms. Method of birth control. Menstrual cycle. Pregnancy history. What immunizations do I need?  Vaccines are usually given at various ages, according to a schedule. Your health care provider will recommend vaccines for you based on your age, medicalhistory, and lifestyle or other factors, such as travel or where you work. What tests do I need? Blood tests Lipid and cholesterol levels. These may be checked every 5 years, or  more often if you are over 41years old. Hepatitis C test. Hepatitis B test. Screening Lung cancer screening. You may have this screening every year starting at age [age]if you have a 30-pack-year history of smoking and currently smoke or have quit within the past 15 years. Colorectal cancer screening. All adults should have this screening starting at age 4181and continuing until age 41 Your health care provider may recommend screening at age 4156if you are at increased risk. You will have tests every 1-10 years, depending on your results and the type of screening test. Diabetes screening. This is done by checking your blood sugar (glucose) after you have not eaten for a while (fasting). You may have this done every 1-3 years. Mammogram. This may be done every 1-2 years. Talk with your health care provider about when you should start having regular mammograms. This may depend on whether you have a family history of breast cancer. BRCA-related cancer screening. This may be done if you have a family history of breast, ovarian, tubal, or peritoneal cancers. Pelvic exam and Pap test. This may be done every 3 years starting at age 41 Starting at age 41 this may be done every 5 years if you have a Pap test in combination with an HPV test. Other tests STD (sexually transmitted disease) testing, if you are at risk. Bone density scan. This is done to screen for osteoporosis. You may have this scan if you are at high risk for osteoporosis. Talk with your health care provider about your test results, treatment options,and if necessary, the need for more tests. Follow these instructions at home: Eating and  drinking  Eat a diet that includes fresh fruits and vegetables, whole grains, lean protein, and low-fat dairy products. Take vitamin and mineral supplements as recommended by your health care provider. Do not drink alcohol if: Your health care provider tells you not to drink. You are pregnant, may  be pregnant, or are planning to become pregnant. If you drink alcohol: Limit how much you have to 0-1 drink a day. Be aware of how much alcohol is in your drink. In the [place] one drink equals one 12 oz bottle of beer (355 mL), one 5 oz glass of wine (148 mL), or one 1 oz glass of hard liquor (44 mL).  Lifestyle Take daily care of your teeth and gums. Brush your teeth every morning and night with fluoride toothpaste. Floss one time each day. Stay active. Exercise for at least 30 minutes 5 or more days each week. Do not use any products that contain nicotine or tobacco, such as cigarettes, e-cigarettes, and chewing tobacco. If you need help quitting, ask your health care provider. Do not use drugs. If you are sexually active, practice safe sex. Use a condom or other form of protection to prevent STIs (sexually transmitted infections). If you do not wish to become pregnant, use a form of birth control. If you plan to become pregnant, see your health care provider for a prepregnancy visit. If told by your health care provider, take low-dose aspirin daily starting at age 41. Find healthy ways to cope with stress, such as: Meditation, yoga, or listening to music. Journaling. Talking to a trusted person. Spending time with friends and family. Safety Always wear your seat belt while driving or riding in a vehicle. Do not drive: If you have been drinking alcohol. Do not ride with someone who has been drinking. When you are tired or distracted. While texting. Wear a helmet and other protective equipment during sports activities. If you have firearms in your house, make sure you follow all gun safety procedures. What's next? Visit your health care provider once a year for an annual wellness visit. Ask your health care provider how often you should have your eyes and teeth checked. Stay up to date on all vaccines. This information is not intended to replace advice given to you by your health care  provider. Make sure you discuss any questions you have with your healthcare provider. Document Revised: 11/21/2019 Document Reviewed: 10/28/2017 Elsevier Patient Education  2022 [employer].

## 2020-09-24 NOTE — Assessment & Plan Note (Signed)
Recheck labs today. 

## 2020-09-24 NOTE — Assessment & Plan Note (Signed)
Significant weight gain despite lifestyle efforts at diet and exercise since switching to lexapro. Will trial dose decrease. Checking lipids, a1c today. F/u in 2-3 months.

## 2020-09-24 NOTE — Progress Notes (Signed)
BP 122/74   Pulse 85   Temp 98.1 F (36.7 C) (Oral)   Resp 16   Ht 4\' 11"  (1.499 m)   Wt 184 lb (83.5 kg)   SpO2 99%   BMI 37.16 kg/m    Subjective:    Patient ID: Sandra DieterMary Apollo, female    DOB: [birth year], 40 y.o.   MRN: [id number]  HPI: Sandra Munoz is a 40 y.o. female presenting on 09/24/2020 for comprehensive medical examination. Current medical complaints include: weight gain  Depression, OCD - Medications: lexapro, hydroxyzine (for itching) - Taking: good - previously on zoloft 150mg  but not as good effect, switched back to lexapro end of 2020, noticed weight gain since then.  - Symptoms: none, controlled "too well" since initiating intuniv.  WEIGHT GAIN - s/p gastric bypass 2016. - 167lb in March, 184lb today. - goes to [employer], increased activity, cardio. - diet hasn't changed. Eats small meals. Rarely eats starchy veggies, carbs. - on guanficine - Duration 2 years after switching from zoloft to lexapro.    Anemia - getting iron infusions and ferritin. Gets B12 injections, last one month ago.   Hives - taking hydroxyzine at night with good relief. Stopped taking other antihistamines. Has cut out dairy.    Depression Screen done today and results listed below:  [facility] 09/24/2020 05/15/2020 04/23/2020 04/23/2020 04/02/2020  Decreased Interest 0 1 0 0 0  Down, Depressed, Hopeless 0 1 1 0 2  PHQ - 2 Score 0 2 1 0 2  Altered sleeping 0 0 2 - 3  Tired, decreased energy 0 0 0 - 1  Change in appetite 0 0 1 - 1  Feeling bad or failure about yourself  0 0 0 - 0  Trouble concentrating 0 0 0 - 3  Moving slowly or fidgety/restless 0 0 1 - 3  Suicidal thoughts 0 0 0 - 0  PHQ-9 Score 0 2 5 - 13  Difficult doing work/chores Not difficult at all Not difficult at all Somewhat difficult - Somewhat difficult  Some recent data might be hidden    The patient does not have a history of falls. I did not complete a risk assessment for falls. A plan of care  for falls was not documented.   Past Medical History:  Past Medical History:  Diagnosis Date   Allergy    Depression    Polycystic ovarian syndrome     Surgical History:  Past Surgical History:  Procedure Laterality Date   ABDOMINAL HYSTERECTOMY     GASTRIC BYPASS     REDUCTION MAMMAPLASTY Bilateral 2002    Medications:  Current Outpatient Medications on File Prior to Visit  Medication Sig   cyanocobalamin (,VITAMIN B-12,) 1000 MCG/ML injection INJECT 1 ML INTO THE SKIN INTRAMUSCULARLY ONCE EVERY MONTH   guanFACINE (INTUNIV) 2 MG TB24 ER tablet Take 2 mg by mouth daily.   hydrOXYzine (VISTARIL) 25 MG capsule TAKE 1-2 CAPSULES (25-50 MG TOTAL) BY MOUTH 3 (THREE) TIMES DAILY AS NEEDED FOR ITCHING (HIVES).   No current facility-administered medications on file prior to visit.    Allergies:  Allergies  Allergen Reactions   Corn-Containing Products Hives   Milk Protein Hives    Social History:  Social History   Socioeconomic History   Marital status: Married    Spouse name: Cristal DeerChristopher   Number of children: 1   Years of education: Not on file   Highest education level: Not on file  Occupational History  Not on file  Tobacco Use   Smoking status: Former    Types: Cigarettes    Quit date: 09/30/2009    Years since quitting: 10.9   Smokeless tobacco: Never  Vaping Use   Vaping Use: Never used  Substance and Sexual Activity   Alcohol use: Yes    Comment: ocasional   Drug use: Never   Sexual activity: Yes    Birth control/protection: Surgical  Other Topics Concern   Not on file  Social History Narrative   Moved from [place]; Accountant; 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    Social Determinants of Health   Financial Resource Strain: Low Risk    Difficulty of Paying Living Expenses: Not hard at all  Food Insecurity: No Food Insecurity   Worried About Programme researcher, broadcasting/film/video in the Last Year: Never true   Ran Out of Food in the Last Year:  Never true  Transportation Needs: No Transportation Needs   Lack of Transportation (Medical): No   Lack of Transportation (Non-Medical): No  Physical Activity: Sufficiently Active   Days of Exercise per Week: 4 days   Minutes of Exercise per Session: 50 min  Stress: No Stress Concern Present   Feeling of Stress : Only a little  Social Connections: Moderately Isolated   Frequency of Communication with Friends and Family: More than three times a week   Frequency of Social Gatherings with Friends and Family: Once a week   Attends Religious Services: Never   Database administrator or Organizations: No   Attends Engineer, structural: Never   Marital Status: Married  Catering manager Violence: Not At Risk   Fear of Current or Ex-Partner: No   Emotionally Abused: No   Physically Abused: No   Sexually Abused: No   Social History   Tobacco Use  Smoking Status Former   Types: Cigarettes   Quit date: 09/30/2009   Years since quitting: 10.9  Smokeless Tobacco Never   Social History   Substance and Sexual Activity  Alcohol Use Yes   Comment: ocasional    Family History:  Family History  Problem Relation Age of Onset   Cancer Mother        SCC of skin/Involved throat   Autoimmune disease Mother    Breast cancer Neg Hx     Past medical history, surgical history, medications, allergies, family history and social history reviewed with patient today and changes made to appropriate areas of the chart.       Objective:    BP 122/74   Pulse 85   Temp 98.1 F (36.7 C) (Oral)   Resp 16   Ht 4\' 11"  (1.499 m)   Wt 184 lb (83.5 kg)   SpO2 99%   BMI 37.16 kg/m   Wt Readings from Last 3 Encounters:  09/24/20 184 lb (83.5 kg)  08/07/20 180 lb (81.6 kg)  05/15/20 167 lb 11.2 oz (76.1 kg)    Physical Exam Constitutional:      Appearance: She is obese.  HENT:     Head: Normocephalic.  Cardiovascular:     Rate and Rhythm: Normal rate and regular rhythm.     Heart  sounds: Normal heart sounds. No murmur heard. Pulmonary:     Effort: Pulmonary effort is normal. No respiratory distress.     Breath sounds: Normal breath sounds.  Abdominal:     General: Bowel sounds are normal.     Palpations: Abdomen is soft.  Tenderness: There is no abdominal tenderness. There is no guarding or rebound.  Musculoskeletal:     Right lower leg: No edema.     Left lower leg: No edema.  Skin:    General: Skin is warm and dry.  Neurological:     Mental Status: She is alert and oriented to person, place, and time. Mental status is at baseline.  Psychiatric:        Mood and Affect: Mood normal.        Behavior: Behavior normal.        Thought Content: Thought content normal.        Judgment: Judgment normal.    Results for orders placed or performed in visit on 08/02/20  Ferritin  Result Value Ref Range   Ferritin 14 11 - 307 ng/mL  Iron and TIBC  Result Value Ref Range   Iron 42 28 - 170 ug/dL   TIBC 614 431 - 540 ug/dL   Saturation Ratios 10 (L) 10.4 - 31.8 %   UIBC 379 ug/dL  Comprehensive metabolic panel  Result Value Ref Range   Sodium 138 135 - 145 mmol/L   Potassium 4.4 3.5 - 5.1 mmol/L   Chloride 108 98 - 111 mmol/L   CO2 26 22 - 32 mmol/L   Glucose, Bld 103 (H) 70 - 99 mg/dL   BUN 15 6 - 20 mg/dL   Creatinine, Ser 0.86 0.44 - 1.00 mg/dL   Calcium 8.5 (L) 8.9 - 10.3 mg/dL   Total Protein 6.5 6.5 - 8.1 g/dL   Albumin 3.7 3.5 - 5.0 g/dL   AST 20 15 - 41 U/L   ALT 17 0 - 44 U/L   Alkaline Phosphatase 35 (L) 38 - 126 U/L   Total Bilirubin 0.5 0.3 - 1.2 mg/dL   GFR, Estimated >76 >19 mL/min   Anion gap 4 (L) 5 - 15  CBC with Differential/Platelet  Result Value Ref Range   WBC 6.9 4.0 - 10.5 K/uL   RBC 4.26 3.87 - 5.11 MIL/uL   Hemoglobin 12.3 12.0 - 15.0 g/dL   HCT 50.9 32.6 - 71.2 %   MCV 85.2 80.0 - 100.0 fL   MCH 28.9 26.0 - 34.0 pg   MCHC 33.9 30.0 - 36.0 g/dL   RDW 45.8 09.9 - 83.3 %   Platelets 265 150 - 400 K/uL   nRBC 0.0 0.0 -  0.2 %   Neutrophils Relative % 59 %   Neutro Abs 4.0 1.7 - 7.7 K/uL   Lymphocytes Relative 34 %   Lymphs Abs 2.3 0.7 - 4.0 K/uL   Monocytes Relative 6 %   Monocytes Absolute 0.4 0.1 - 1.0 K/uL   Eosinophils Relative 1 %   Eosinophils Absolute 0.1 0.0 - 0.5 K/uL   Basophils Relative 0 %   Basophils Absolute 0.0 0.0 - 0.1 K/uL   Immature Granulocytes 0 %   Abs Immature Granulocytes 0.01 0.00 - 0.07 K/uL      Assessment & Plan:   Problem List Items Addressed This Visit       Other   Anxiety disorder    Well controlled though noticed significant weight gain since switching to lexapro despite dieting and increase in activity. Will decrease dose slightly to 10mg . Continue intuniv and diet and exercise efforts.        Relevant Medications   escitalopram (LEXAPRO) 10 MG tablet   Obesity (BMI 35.0-39.9 without comorbidity)    Significant weight gain despite lifestyle efforts at  diet and exercise since switching to lexapro. Will trial dose decrease. Checking lipids, a1c today. F/u in 2-3 months.       Hyperlipidemia    Recheck labs today.       Current mild episode of major depressive disorder (HCC)   Relevant Medications   escitalopram (LEXAPRO) 10 MG tablet   B12 deficiency    Checking levels today.       Relevant Orders   B12   Iron deficiency anemia following bariatric surgery    Receiving iron infusions, last Hb wnl. Continue following with Heme/Onc.       Other Visit Diagnoses     Class 2 obesity without serious comorbidity with body mass index (BMI) of 37.0 to 37.9 in adult, unspecified obesity type    -  Primary   Relevant Orders   Lipid panel   Hemoglobin A1c        Follow up plan: Return in about 3 months (around 12/25/2020) for weight gain.   LABORATORY TESTING:  - Pap smear: not applicable, s/p hysterectomy  IMMUNIZATIONS:   - Tdap: Tetanus vaccination status reviewed: last tetanus booster within 10 years. - Influenza: Postponed to flu  season - Pneumovax: Not applicable - Prevnar: Not applicable - HPV: Not applicable - Zostavax vaccine: Not applicable - COVID: 4 doses of mRNA vaccine  SCREENING: - Mammogram: Up to date  - Colonoscopy: Not applicable  - Bone Density: Not applicable  - Lung cancer screening: n/a  PATIENT COUNSELING:   Advised to take 1 mg of folate supplement per day if capable of pregnancy.   Sexuality: Discussed sexually transmitted diseases, partner selection, use of condoms, avoidance of unintended pregnancy  and contraceptive alternatives.   Advised to avoid cigarette smoking.  I discussed with the patient that most people either abstain from alcohol or drink within safe limits (<=14/week and <=4 drinks/occasion for males, <=7/weeks and <= 3 drinks/occasion for females) and that the risk for alcohol disorders and other health effects rises proportionally with the number of drinks per week and how often a drinker exceeds daily limits.  Discussed cessation/primary prevention of drug use and availability of treatment for abuse.   Diet: Encouraged to adjust caloric intake to maintain  or achieve ideal body weight, to reduce intake of dietary saturated fat and total fat, to limit sodium intake by avoiding high sodium foods and not adding table salt, and to maintain adequate dietary potassium and calcium preferably from fresh fruits, vegetables, and low-fat dairy products.    Stressed the importance of regular exercise  Injury prevention: Discussed safety belts, safety helmets, smoke detector, smoking near bedding or upholstery.   Dental health: Discussed importance of regular tooth brushing, flossing, and dental visits.    NEXT PREVENTATIVE PHYSICAL DUE IN 1 YEAR. Return in about 3 months (around 12/25/2020) for weight gain.

## 2020-09-24 NOTE — Assessment & Plan Note (Signed)
Checking levels today

## 2020-09-24 NOTE — Assessment & Plan Note (Signed)
Well controlled though noticed significant weight gain since switching to lexapro despite dieting and increase in activity. Will decrease dose slightly to 10mg . Continue intuniv and diet and exercise efforts.

## 2020-09-25 LAB — LIPID PANEL
Cholesterol: 195 mg/dL (ref <200)
HDL: 52 mg/dL (ref >=50)
LDL Cholesterol (Calc): 122 mg/dL (calc) — ABNORMAL HIGH
Non-HDL Cholesterol (Calc): 143 mg/dL (calc) — ABNORMAL HIGH (ref <130)
Total CHOL/HDL Ratio: 3.8 (calc) (ref <5.0)
Triglycerides: 106 mg/dL (ref <150)

## 2020-09-25 LAB — HEMOGLOBIN A1C
Hgb A1c MFr Bld: 5.4 % of total Hgb (ref <5.7)
Mean Plasma Glucose: 108 mg/dL
eAG (mmol/L): 6 mmol/L

## 2020-09-25 LAB — VITAMIN B12: Vitamin B-12: 198 pg/mL — ABNORMAL LOW (ref 200–1100)

## 2020-09-30 ENCOUNTER — Other Ambulatory Visit: Payer: Self-pay | Admitting: Family Medicine

## 2020-09-30 DIAGNOSIS — F419 Anxiety disorder, unspecified: Secondary | ICD-10-CM

## 2020-09-30 DIAGNOSIS — F320 Major depressive disorder, single episode, mild: Secondary | ICD-10-CM

## 2020-10-26 ENCOUNTER — Other Ambulatory Visit: Payer: Self-pay | Admitting: Family Medicine

## 2020-10-26 DIAGNOSIS — F419 Anxiety disorder, unspecified: Secondary | ICD-10-CM

## 2020-10-26 DIAGNOSIS — F320 Major depressive disorder, single episode, mild: Secondary | ICD-10-CM

## 2020-11-18 ENCOUNTER — Encounter: Payer: Self-pay | Admitting: Family Medicine

## 2020-11-18 ENCOUNTER — Telehealth (INDEPENDENT_AMBULATORY_CARE_PROVIDER_SITE_OTHER): Payer: 59 | Admitting: Family Medicine

## 2020-11-18 VITALS — Temp 101.4°F | Wt 184.0 lb

## 2020-11-18 DIAGNOSIS — Z20822 Contact with and (suspected) exposure to covid-19: Secondary | ICD-10-CM | POA: Diagnosis not present

## 2020-11-18 DIAGNOSIS — J209 Acute bronchitis, unspecified: Secondary | ICD-10-CM | POA: Diagnosis not present

## 2020-11-18 DIAGNOSIS — J0140 Acute pansinusitis, unspecified: Secondary | ICD-10-CM | POA: Diagnosis not present

## 2020-11-18 MED ORDER — ALBUTEROL SULFATE HFA 108 (90 BASE) MCG/ACT IN AERS: 2.0000 | INHALATION_SPRAY | Freq: Four times a day (QID) | RESPIRATORY_TRACT | 0 refills | Status: DC | PRN

## 2020-11-18 MED ORDER — PREDNISONE 20 MG PO TABS: 40.0000 mg | ORAL_TABLET | Freq: Every day | ORAL | 0 refills | Status: AC

## 2020-11-18 MED ORDER — DOXYCYCLINE HYCLATE 100 MG PO TABS: 100.0000 mg | ORAL_TABLET | Freq: Two times a day (BID) | ORAL | 0 refills | Status: AC

## 2020-11-18 MED ORDER — BENZONATATE 100 MG PO CAPS: 100.0000 mg | ORAL_CAPSULE | Freq: Three times a day (TID) | ORAL | 0 refills | Status: DC | PRN

## 2020-11-18 NOTE — Progress Notes (Signed)
Name: Sandra Munoz   MRN: [id number]    DOB: [birth year]   Date:11/18/2020       Progress Note  Subjective:    Chief Complaint  Chief Complaint  Patient presents with   Sinusitis    Cough, congestion sore throat, tested negative with at home test    I connected with  [name]  on 11/18/20 at 10:40 AM EDT by a video enabled telemedicine application and verified that I am speaking with the correct person using two identifiers.  I discussed the limitations of evaluation and management by telemedicine and the availability of in person appointments. The patient expressed understanding and agreed to proceed. Staff also discussed with the patient that there may be a patient responsible charge related to this service. Patient Location: home Provider Location: [facility] Additional Individuals present: none  Sinusitis Associated symptoms include chills, congestion, coughing, diaphoresis, sinus pressure and a sore throat. Pertinent negatives include no ear pain or shortness of breath.  Presents with fever today Saturday she has sore throat, sinus pressure and eye pain  Cough - productive yellow sputum, tightness in chest, no SOB or CP Fever this am 101.4 Took tylenol Husband is sick  Hx of bronchitis, former smoker, and grew up in home with second hand smoke exposure  No GI sx, no severe body aches, but generalized fatigue with fever, hot/cold flashes and sweats      Patient Active Problem List   Diagnosis Date Noted   Vitamin D deficiency 09/20/2019   Obesity (BMI 35.0-39.9 without comorbidity) 09/20/2019   Hyperlipidemia 09/20/2019   Current mild episode of major depressive disorder (HCC) 09/20/2019   Insomnia due to other mental disorder 09/20/2019   S/P gastric bypass 09/20/2019   B12 deficiency 09/20/2019   Iron deficiency anemia following bariatric surgery 09/20/2019   History of OCD (obsessive compulsive disorder) 08/30/2018   Anxiety disorder 08/30/2018   Hx of cold  sores 08/30/2018   Urticaria of unknown origin 02/13/2013    Social History   Tobacco Use   Smoking status: Former    Types: Cigarettes    Quit date: 09/30/2009    Years since quitting: 11.1   Smokeless tobacco: Never  Substance Use Topics   Alcohol use: Yes    Comment: ocasional     Current Outpatient Medications:    cyanocobalamin (,VITAMIN B-12,) 1000 MCG/ML injection, INJECT 1 ML INTO THE SKIN INTRAMUSCULARLY ONCE EVERY MONTH, Disp: 1 mL, Rfl: 11   escitalopram (LEXAPRO) 10 MG tablet, Take 1 tablet (10 mg total) by mouth daily., Disp: 135 tablet, Rfl: 1   guanFACINE (INTUNIV) 2 MG TB24 ER tablet, Take 2 mg by mouth daily., Disp: , Rfl:    hydrOXYzine (VISTARIL) 25 MG capsule, TAKE 1-2 CAPSULES (25-50 MG TOTAL) BY MOUTH 3 (THREE) TIMES DAILY AS NEEDED FOR ITCHING (HIVES)., Disp: 180 capsule, Rfl: 1  Allergies  Allergen Reactions   Corn-Containing Products Hives   Milk Protein Hives    I personally reviewed active problem list, medication list, allergies, family history, social history, health maintenance, notes from last encounter, lab results, imaging with the patient/caregiver today.   Review of Systems  Constitutional:  Positive for chills, diaphoresis, fatigue and fever. Negative for appetite change and unexpected weight change.  HENT:  Positive for congestion, sinus pressure, sinus pain and sore throat. Negative for ear discharge, ear pain, rhinorrhea, tinnitus, trouble swallowing and voice change.   Eyes: Negative.   Respiratory:  Positive for cough and chest tightness. Negative for  apnea, choking, shortness of breath, wheezing and stridor.   Cardiovascular: Negative.  Negative for chest pain, palpitations and leg swelling.  Gastrointestinal: Negative.  Negative for abdominal pain, constipation, diarrhea, nausea and vomiting.  Endocrine: Negative.   Genitourinary: Negative.   Musculoskeletal: Negative.   Skin: Negative.   Allergic/Immunologic: Negative.    Neurological: Negative.   Hematological: Negative.   Psychiatric/Behavioral: Negative.    All other systems reviewed and are negative. Neg other than noted as acute pertinent positives/negs in HPI  Objective:   Virtual encounter, vitals limited, only able to obtain the following Today's Vitals   11/18/20 1101  Weight: 184 lb (83.5 kg)   Body mass index is 37.16 kg/m. Nursing Note and Vital Signs reviewed.  Physical Exam Vitals and nursing note reviewed.  Constitutional:      General: She is not in acute distress.    Appearance: Normal appearance. She is not toxic-appearing or diaphoretic. Ill appearance: mildly ill appearing, non-toxic. HENT:     Head: Normocephalic and atraumatic.     Right Ear: External ear normal.     Left Ear: External ear normal.     Nose: Congestion present.     Comments: Pt palpated forehead, around eye and max sinus and noted tenderness bilaterally Pulmonary:     Effort: No tachypnea, accessory muscle usage, respiratory distress or retractions.     Comments: Frequent coughing, no distress, no audible wheeze or stridor, speaking in full and complete sentences Neurological:     Mental Status: She is alert. Mental status is at baseline.  Psychiatric:        Mood and Affect: Mood normal.        Behavior: Behavior normal.    PE limited by virtual encounter  No results found for this or any previous visit (from the past 72 hour(s)).  Assessment and Plan:     ICD-10-CM   1. Acute bronchitis, unspecified organism  J20.9 predniSONE (DELTASONE) 20 MG tablet    albuterol (VENTOLIN HFA) 108 (90 Base) MCG/ACT inhaler    benzonatate (TESSALON) 100 MG capsule    2. Acute non-recurrent pansinusitis  J01.40 doxycycline (VIBRA-TABS) 100 MG tablet    3. Suspected COVID-19 virus infection  Z20.822 Novel Coronavirus, NAA ([facility])     Sinus ttp on exam with fever and other upper respiratory sx, productive cough, chest tightness former smoker and exposure to  2nd hand smoke with hx of bronchitis, feels abx are warranted to cover ABS Tx bronchitis as noted above, also urged antihistamines, OTC cough meds, mucinex, rest, fluids, NSAIDs or tylenol for fever/aches/-pains With doxy and prednisone reviewed careful instructions on how to protect stomach and warned of possibly GI upset - pepcid prn ok  Come by for COVID/flu? Testing  Reviewed and educated pt about albuterol inhaler use   -Red flags and when to present for emergency care or RTC including fever >101.20F, chest pain, shortness of breath, new/worsening/un-resolving symptoms, reviewed with patient at time of visit. Follow up and care instructions discussed and provided in AVS. - I discussed the assessment and treatment plan with the patient. The patient was provided an opportunity to ask questions and all were answered. The patient agreed with the plan and demonstrated an understanding of the instructions.  I provided 21 minutes of non-face-to-face time during this encounter.  Danelle Berry, PA-C 11/18/20 11:04 AM

## 2020-11-29 ENCOUNTER — Other Ambulatory Visit: Payer: Self-pay | Admitting: Family Medicine

## 2020-11-29 DIAGNOSIS — L509 Urticaria, unspecified: Secondary | ICD-10-CM

## 2020-11-29 DIAGNOSIS — R21 Rash and other nonspecific skin eruption: Secondary | ICD-10-CM

## 2020-11-29 DIAGNOSIS — F320 Major depressive disorder, single episode, mild: Secondary | ICD-10-CM

## 2020-11-29 DIAGNOSIS — F419 Anxiety disorder, unspecified: Secondary | ICD-10-CM

## 2020-12-10 ENCOUNTER — Other Ambulatory Visit: Payer: Self-pay | Admitting: Family Medicine

## 2020-12-10 DIAGNOSIS — J209 Acute bronchitis, unspecified: Secondary | ICD-10-CM

## 2020-12-26 ENCOUNTER — Ambulatory Visit: Payer: 59 | Admitting: Family Medicine

## 2020-12-26 ENCOUNTER — Other Ambulatory Visit: Payer: Self-pay

## 2020-12-26 ENCOUNTER — Encounter: Payer: Self-pay | Admitting: Nurse Practitioner

## 2020-12-26 ENCOUNTER — Ambulatory Visit: Payer: 59 | Admitting: Nurse Practitioner

## 2020-12-26 VITALS — BP 124/76 | HR 87 | Temp 98.0°F | Resp 16 | Ht 59.0 in | Wt 176.6 lb

## 2020-12-26 DIAGNOSIS — Z6835 Body mass index (BMI) 35.0-35.9, adult: Secondary | ICD-10-CM

## 2020-12-26 DIAGNOSIS — F419 Anxiety disorder, unspecified: Secondary | ICD-10-CM

## 2020-12-26 DIAGNOSIS — Z23 Encounter for immunization: Secondary | ICD-10-CM | POA: Diagnosis not present

## 2020-12-26 DIAGNOSIS — F320 Major depressive disorder, single episode, mild: Secondary | ICD-10-CM

## 2020-12-26 DIAGNOSIS — R635 Abnormal weight gain: Secondary | ICD-10-CM | POA: Diagnosis not present

## 2020-12-26 DIAGNOSIS — Z9884 Bariatric surgery status: Secondary | ICD-10-CM

## 2020-12-26 DIAGNOSIS — Z131 Encounter for screening for diabetes mellitus: Secondary | ICD-10-CM

## 2020-12-26 DIAGNOSIS — E785 Hyperlipidemia, unspecified: Secondary | ICD-10-CM

## 2020-12-26 MED ORDER — DULOXETINE HCL 30 MG PO CPEP
30.0000 mg | ORAL_CAPSULE | Freq: Every day | ORAL | 0 refills | Status: DC
Start: 2020-12-26 — End: 2021-01-21

## 2020-12-26 NOTE — Progress Notes (Signed)
BP 124/76   Pulse 87   Temp 98 F (36.7 C) (Oral)   Resp 16   Ht 4\' 11"  (1.499 m)   Wt 176 lb 9.6 oz (80.1 kg)   SpO2 98%   BMI 35.67 kg/m    Subjective:    Patient ID: , female    DOB: 06/02/1980, 40 y.o.   MRN: [id number]  HPI: Sandra Munoz is a 40 y.o. female  Chief Complaint  Patient presents with   Follow-up    Weight issues   Referral    Weight loss clinic   Morbid Obesity/ Weight gain:  She says she has been gaining weight steadily since her hysterectomy in 2019. She had gastric bypass in 2016, 2017.  Starting weight 230 lbs and 127 lowest weight,which was lost in eight months.  Had hysterectomy in 2019, weight started coming back.  She continues to be physical active, daily calories 1000-1200 calories a day.  She has gained about 40 lbs over this period of time. She noticed even more weight gain when she started the Lexapro.  Last visit her dose was decreased to see if that helped with the weight gain.  Weight today is 176, was 184 at last visit 11/18/20.  Will change Lexapro to Cymbalta.  Discussed weight loss medication, she will consider.  Will see if there continues to be weight loss after changing to Cymbalta.   Hyperlipidemia: Last LDL was 122 on 09/24/20.  The 10-year ASCVD risk score (Arnett DK, et al., 2019) is: 0.6%   Values used to calculate the score:     Age: 71 years     Sex: Female     Is Non-Hispanic African American: No     Diabetic: No     Tobacco smoker: No     Systolic Blood Pressure: 124 mmHg     Is BP treated: No     HDL Cholesterol: 52 mg/dL     Total Cholesterol: 195 mg/dL   Depression/anxiety: Current PHQ9 and GAD score is 0.  She is currently taking Lexapro 10 mg daily and hydroxyzine 25 mg TID as needed.  She says her mood has been good, except she is upset about the weight gain.  Discussed changing from Lexapro to Cymbalta and see if that impacts the weight gain.  Discussed that with starting a new medication it might  take time to find the right dose.  She verbalized understanding and is agreeable to plan.    Depression screen [facility] 2/9 12/26/2020 12/26/2020 11/18/2020 09/24/2020 05/15/2020  Decreased Interest 0 0 0 0 1  Down, Depressed, Hopeless 0 0 0 0 1  PHQ - 2 Score 0 0 0 0 2  Altered sleeping 0 - 0 0 0  Tired, decreased energy 0 - 0 0 0  Change in appetite 0 - 0 0 0  Feeling bad or failure about yourself  0 - 0 0 0  Trouble concentrating 0 - 0 0 0  Moving slowly or fidgety/restless 0 - 0 0 0  Suicidal thoughts 0 - 0 0 0  PHQ-9 Score 0 - 0 0 2  Difficult doing work/chores - - Not difficult at all Not difficult at all Not difficult at all  Some recent data might be hidden    GAD 7 : Generalized Anxiety Score 09/24/2020 04/02/2020 02/14/2020 01/03/2019  Nervous, Anxious, on Edge 0 2 3 3   Control/stop worrying 0 1 1 2   Worry too much - different things 0 2 2  2  Trouble relaxing 0 3 3 3   Restless 0 3 3 3   Easily annoyed or irritable 0 1 2 3   Afraid - awful might happen 0 0 0 0  Total GAD 7 Score 0 12 14 16   Anxiety Difficulty Not difficult at all Not difficult at all Somewhat difficult Very difficult     Relevant past medical, surgical, family and social history reviewed and updated as indicated. Interim medical history since our last visit reviewed. Allergies and medications reviewed and updated.  Review of Systems  Constitutional: Negative for fever or weight change.  Respiratory: Negative for cough and shortness of breath.   Cardiovascular: Negative for chest pain or palpitations.  Gastrointestinal: Negative for abdominal pain, no bowel changes.  Musculoskeletal: Negative for gait problem or joint swelling.  Skin: Negative for rash.  Neurological: Negative for dizziness or headache.  No other specific complaints in a complete review of systems (except as listed in HPI above).      Objective:    BP 124/76   Pulse 87   Temp 98 F (36.7 C) (Oral)   Resp 16   Ht 4\' 11"  (1.499 m)   Wt 176  lb 9.6 oz (80.1 kg)   SpO2 98%   BMI 35.67 kg/m   Wt Readings from Last 3 Encounters:  12/26/20 176 lb 9.6 oz (80.1 kg)  11/18/20 184 lb (83.5 kg)  09/24/20 184 lb (83.5 kg)    Physical Exam  Constitutional: Patient appears well-developed and well-nourished. Obese No distress.  HEENT: head atraumatic, normocephalic, pupils equal and reactive to light, neck supple Cardiovascular: Normal rate, regular rhythm and normal heart sounds.  No murmur heard. No BLE edema. Pulmonary/Chest: Effort normal and breath sounds normal. No respiratory distress. Abdominal: Soft.  There is no tenderness. Psychiatric: Patient has a normal mood and affect. behavior is normal. Judgment and thought content normal.   Results for orders placed or performed in visit on 09/24/20  B12  Result Value Ref Range   Vitamin B-12 198 (L) 200 - 1,100 pg/mL  Lipid panel  Result Value Ref Range   Cholesterol 195 <200 mg/dL   HDL 52 > OR = 50 mg/dL   Triglycerides 12/28/20 mg/dL   LDL Cholesterol (Calc) 122 (H) mg/dL (calc)   Total CHOL/HDL Ratio 3.8 <5.0 (calc)   Non-HDL Cholesterol (Calc) 143 (H) <130 mg/dL (calc)  Hemoglobin 11/20/20  Result Value Ref Range   Hgb A1c MFr Bld 5.4 <5.7 % of total Hgb   Mean Plasma Glucose 108 mg/dL   eAG (mmol/L) 6.0 mmol/L      Assessment & Plan:   1. Class 2 severe obesity with serious comorbidity and body mass index (BMI) of 35.0 to 35.9 in adult, unspecified obesity type (HCC)  - Hemoglobin A1c  2. Weight gain   3. S/P gastric bypass   4. Hyperlipidemia, unspecified hyperlipidemia type   5. Current mild episode of major depressive disorder, unspecified whether recurrent (HCC)  - DULoxetine (CYMBALTA) 30 MG capsule; Take 1 capsule (30 mg total) by mouth daily.  Dispense: 30 capsule; Refill: 0  6. Anxiety disorder, unspecified type  - DULoxetine (CYMBALTA) 30 MG capsule; Take 1 capsule (30 mg total) by mouth daily.  Dispense: 30 capsule; Refill: 0  7. Need for  influenza vaccination  - Flu Vaccine QUAD 6+ mos PF IM (Fluarix Quad PF)  8. Screening for diabetes mellitus  - Hemoglobin A1c   Follow up plan: Return in about 4 weeks (around 01/23/2021)  for follow up.

## 2020-12-27 LAB — HEMOGLOBIN A1C
Hgb A1c MFr Bld: 5.3 % of total Hgb (ref <5.7)
Mean Plasma Glucose: 105 mg/dL
eAG (mmol/L): 5.8 mmol/L

## 2020-12-29 ENCOUNTER — Other Ambulatory Visit: Payer: Self-pay | Admitting: Family Medicine

## 2020-12-29 DIAGNOSIS — R21 Rash and other nonspecific skin eruption: Secondary | ICD-10-CM

## 2020-12-29 DIAGNOSIS — L509 Urticaria, unspecified: Secondary | ICD-10-CM

## 2020-12-29 NOTE — Telephone Encounter (Signed)
Pharmacy requesting 90 day refills.  Requested Prescriptions  Pending Prescriptions Disp Refills   hydrOXYzine (VISTARIL) 25 MG capsule [Pharmacy Med Name: HYDROXYZINE PAM 25 MG CAP] 540 capsule 1    Sig: TAKE 1-2 CAPSULES (25-50 MG TOTAL) BY MOUTH 3 (THREE) TIMES DAILY AS NEEDED FOR ITCHING (HIVES).     Ear, Nose, and Throat:  Antihistamines Passed - 12/29/2020 12:30 PM      Passed - Valid encounter within last 12 months    Recent Outpatient Visits           3 days ago Class 2 severe obesity with serious comorbidity and body mass index (BMI) of 35.0 to 35.9 in adult, unspecified obesity type [facility])   [facility] Della Goo F, FNP   1 month ago Acute bronchitis, unspecified organism   [facility] Danelle Berry, PA-C   3 months ago Class 2 obesity without serious comorbidity with body mass index (BMI) of 37.0 to 37.9 in adult, unspecified obesity type   [facility] Caro Laroche, DO   7 months ago Urticaria of unknown origin   [facility] Danelle Berry, PA-C   8 months ago Urticaria of unknown origin   [facility] Caro Laroche, [place]

## 2021-01-17 ENCOUNTER — Other Ambulatory Visit: Payer: Self-pay | Admitting: Nurse Practitioner

## 2021-01-17 DIAGNOSIS — F320 Major depressive disorder, single episode, mild: Secondary | ICD-10-CM

## 2021-01-17 DIAGNOSIS — F419 Anxiety disorder, unspecified: Secondary | ICD-10-CM

## 2021-01-17 NOTE — Telephone Encounter (Signed)
Requested medication (s) are due for refill today: NO  Requested medication (s) are on the active medication list: YES  Last refill:  12/26/20 #30/0 RF  Future visit scheduled: NO  Notes to clinic:  90 day supply requested. New prescription, please advise. Pt suppose to f/up in 4 weeks     Requested Prescriptions  Pending Prescriptions Disp Refills   DULoxetine (CYMBALTA) 30 MG capsule [Pharmacy Med Name: DULOXETINE HCL DR 30 MG CAP] 90 capsule 1    Sig: TAKE 1 CAPSULE BY MOUTH EVERY DAY     Psychiatry: Antidepressants - SNRI Passed - 01/17/2021 11:32 AM      Passed - Completed PHQ-2 or PHQ-9 in the last 360 days      Passed - Last BP in normal range    BP Readings from Last 1 Encounters:  12/26/20 124/76          Passed - Valid encounter within last 6 months    Recent Outpatient Visits           3 weeks ago Class 2 severe obesity with serious comorbidity and body mass index (BMI) of 35.0 to 35.9 in adult, unspecified obesity type [facility])   [facility] Della Goo F, FNP   2 months ago Acute bronchitis, unspecified organism   [facility] Danelle Berry, PA-C   3 months ago Class 2 obesity without serious comorbidity with body mass index (BMI) of 37.0 to 37.9 in adult, unspecified obesity type   [facility] Caro Laroche, DO   8 months ago Urticaria of unknown origin   [facility] Danelle Berry, PA-C   8 months ago Urticaria of unknown origin   [facility] Caro Laroche, [place]

## 2021-01-21 ENCOUNTER — Other Ambulatory Visit: Payer: Self-pay | Admitting: Nurse Practitioner

## 2021-01-21 DIAGNOSIS — F419 Anxiety disorder, unspecified: Secondary | ICD-10-CM

## 2021-01-21 DIAGNOSIS — F320 Major depressive disorder, single episode, mild: Secondary | ICD-10-CM

## 2021-01-21 MED ORDER — DULOXETINE HCL 30 MG PO CPEP: 30.0000 mg | ORAL_CAPSULE | Freq: Every day | ORAL | 0 refills | Status: DC

## 2021-01-21 NOTE — Telephone Encounter (Signed)
Pt was not happy that she has to come back in for a refill. Appt made for tomorrow 01-22-2021

## 2021-01-21 NOTE — Telephone Encounter (Signed)
Pt was supposed to be seen for 4 week f/u for refill

## 2021-01-22 ENCOUNTER — Encounter: Payer: Self-pay | Admitting: Family Medicine

## 2021-01-22 ENCOUNTER — Ambulatory Visit: Payer: 59 | Admitting: Family Medicine

## 2021-01-22 ENCOUNTER — Other Ambulatory Visit: Payer: Self-pay

## 2021-01-22 VITALS — BP 124/78 | HR 98 | Temp 98.0°F | Resp 16 | Ht 59.0 in | Wt 175.2 lb

## 2021-01-22 DIAGNOSIS — Z6835 Body mass index (BMI) 35.0-35.9, adult: Secondary | ICD-10-CM

## 2021-01-22 DIAGNOSIS — F320 Major depressive disorder, single episode, mild: Secondary | ICD-10-CM | POA: Diagnosis not present

## 2021-01-22 DIAGNOSIS — F419 Anxiety disorder, unspecified: Secondary | ICD-10-CM

## 2021-01-22 DIAGNOSIS — Z9884 Bariatric surgery status: Secondary | ICD-10-CM

## 2021-01-22 DIAGNOSIS — R1013 Epigastric pain: Secondary | ICD-10-CM

## 2021-01-22 DIAGNOSIS — E538 Deficiency of other specified B group vitamins: Secondary | ICD-10-CM

## 2021-01-22 DIAGNOSIS — E785 Hyperlipidemia, unspecified: Secondary | ICD-10-CM

## 2021-01-22 DIAGNOSIS — R1110 Vomiting, unspecified: Secondary | ICD-10-CM

## 2021-01-22 DIAGNOSIS — R635 Abnormal weight gain: Secondary | ICD-10-CM | POA: Diagnosis not present

## 2021-01-22 DIAGNOSIS — E66812 Obesity, class 2: Secondary | ICD-10-CM

## 2021-01-22 MED ORDER — DULOXETINE HCL 30 MG PO CPEP: 30.0000 mg | ORAL_CAPSULE | Freq: Every day | ORAL | 3 refills | Status: DC

## 2021-01-22 NOTE — Progress Notes (Signed)
Name: Sandra Munoz   MRN: [id number]    DOB: 08-25-1980   Date:01/22/2021       Progress Note  Chief Complaint  Patient presents with   Hyperlipidemia   Anxiety   Weight Check     Subjective:   Sandra Munoz is a 40 y.o. female, presents to clinic for f/up on MDD/med change in July with Dr. Linwood Dibbles and then Raynelle Fanning NP  B12 was low at that time  A1C was normal  Cholesterol was high   MDD:  phq reviewed today and negative Depression screen [facility] 2/9 01/22/2021 12/26/2020 12/26/2020 11/18/2020 09/24/2020  Decreased Interest 0 0 0 0 0  Down, Depressed, Hopeless 0 0 0 0 0  PHQ - 2 Score 0 0 0 0 0  Altered sleeping 0 0 - 0 0  Tired, decreased energy 0 0 - 0 0  Change in appetite 0 0 - 0 0  Feeling bad or failure about yourself  0 0 - 0 0  Trouble concentrating 0 0 - 0 0  Moving slowly or fidgety/restless 0 0 - 0 0  Suicidal thoughts 0 0 - 0 0  PHQ-9 Score 0 0 - 0 0  Difficult doing work/chores Not difficult at all - - Not difficult at all Not difficult at all  Some recent data might be hidden   GAD 7 : Generalized Anxiety Score 01/22/2021 12/26/2020 09/24/2020 04/02/2020  Nervous, Anxious, on Edge 0 0 0 2  Control/stop worrying 0 0 0 1  Worry too much - different things 0 0 0 2  Trouble relaxing 0 0 0 3  Restless 0 0 0 3  Easily annoyed or irritable 0 0 0 1  Afraid - awful might happen 0 0 0 0  Total GAD 7 Score 0 0 0 12  Anxiety Difficulty Not difficult at all - Not difficult at all Not difficult at all    Obesity, s/p gastric bypass Wt Readings from Last 15 Encounters:  01/22/21 175 lb 3.2 oz (79.5 kg)  12/26/20 176 lb 9.6 oz (80.1 kg)  11/18/20 184 lb (83.5 kg)  09/24/20 184 lb (83.5 kg)  08/07/20 180 lb (81.6 kg)  05/15/20 167 lb 11.2 oz (76.1 kg)  04/23/20 161 lb 6.4 oz (73.2 kg)  04/02/20 156 lb 3.2 oz (70.9 kg)  02/14/20 160 lb 9.6 oz (72.8 kg)  02/06/20 158 lb (71.7 kg)  10/06/19 161 lb (73 kg)  09/20/19 165 lb 6.4 oz (75 kg)  09/07/19 157 lb 4.8 oz (71.4  kg)  05/09/19 152 lb 4.8 oz (69.1 kg)  09/09/18 148 lb 12.8 oz (67.5 kg)   BMI Readings from Last 5 Encounters:  01/22/21 35.39 kg/m  12/26/20 35.67 kg/m  11/18/20 37.16 kg/m  09/24/20 37.16 kg/m  08/07/20 36.36 kg/m   Malabsobtion s/t surgery: Lab Results  Component Value Date   VITAMINB12 198 (L) 09/24/2020  She has f/up with specialists for repeat labs, is doing b12 shots at home, may need rx of syringes and needles - she will send message  Concerns about weight gain with lexapro, but cymbalta works really well for her    Current Outpatient Medications:    cyanocobalamin (,VITAMIN B-12,) 1000 MCG/ML injection, INJECT 1 ML INTO THE SKIN INTRAMUSCULARLY ONCE EVERY MONTH, Disp: 1 mL, Rfl: 11   DULoxetine (CYMBALTA) 30 MG capsule, Take 1 capsule (30 mg total) by mouth daily., Disp: 30 capsule, Rfl: 0   guanFACINE (INTUNIV) 2 MG TB24 ER tablet, Take 2 mg by  mouth daily., Disp: , Rfl:    hydrOXYzine (VISTARIL) 25 MG capsule, TAKE 1-2 CAPSULES (25-50 MG TOTAL) BY MOUTH 3 (THREE) TIMES DAILY AS NEEDED FOR ITCHING (HIVES)., Disp: 180 capsule, Rfl: 1  Patient Active Problem List   Diagnosis Date Noted   Vitamin D deficiency 09/20/2019   Hyperlipidemia 09/20/2019   Current mild episode of major depressive disorder (HCC) 09/20/2019   Insomnia due to other mental disorder 09/20/2019   S/P gastric bypass 09/20/2019   B12 deficiency 09/20/2019   Iron deficiency anemia following bariatric surgery 09/20/2019   History of OCD (obsessive compulsive disorder) 08/30/2018   Anxiety disorder 08/30/2018   Hx of cold sores 08/30/2018   Urticaria of unknown origin 02/13/2013    Past Surgical History:  Procedure Laterality Date   ABDOMINAL HYSTERECTOMY     GASTRIC BYPASS     REDUCTION MAMMAPLASTY Bilateral 2002    Family History  Problem Relation Age of Onset   Cancer Mother        SCC of skin/Involved throat   Autoimmune disease Mother    Breast cancer Neg Hx     Social History    Tobacco Use   Smoking status: Former    Types: Cigarettes    Quit date: 09/30/2009    Years since quitting: 11.3   Smokeless tobacco: Never  Vaping Use   Vaping Use: Never used  Substance Use Topics   Alcohol use: Yes    Comment: ocasional   Drug use: Never     Allergies  Allergen Reactions   Corn-Containing Products Other (See Comments)    She says she is only occasionally sensitive to corn   Milk Protein Hives    Health Maintenance  Topic Date Due   COVID-19 Vaccine (5 - Booster for Pfizer series) 11/16/2020   MAMMOGRAM  05/09/2021   TETANUS/TDAP  06/01/2027   INFLUENZA VACCINE  Completed   Hepatitis C Screening  Completed   HIV Screening  Completed   Pneumococcal Vaccine 15-66 Years old  Aged Out   HPV VACCINES  Aged Out    Chart Review Today: I personally reviewed active problem list, medication list, allergies, family history, social history, health maintenance, notes from last encounter, lab results, imaging with the patient/caregiver today.   Review of Systems  Constitutional: Negative.   HENT: Negative.    Eyes: Negative.   Respiratory: Negative.    Cardiovascular: Negative.   Gastrointestinal: Negative.   Endocrine: Negative.   Genitourinary: Negative.   Musculoskeletal: Negative.   Skin: Negative.   Allergic/Immunologic: Negative.   Neurological: Negative.   Hematological: Negative.   Psychiatric/Behavioral: Negative.    All other systems reviewed and are negative.   Objective:   Vitals:   01/22/21 1532  BP: 124/78  Pulse: 98  Resp: 16  Temp: 98 F (36.7 C)  TempSrc: Oral  SpO2: 98%  Weight: 175 lb 3.2 oz (79.5 kg)  Height: 4\' 11"  (1.499 m)    Body mass index is 35.39 kg/m.  Physical Exam Vitals and nursing note reviewed.  Constitutional:      General: She is not in acute distress.    Appearance: She is well-developed. She is obese. She is not ill-appearing, toxic-appearing or diaphoretic.  HENT:     Head: Normocephalic and  atraumatic.     Nose: Nose normal.  Eyes:     General:        Right eye: No discharge.        Left eye: No discharge.  Conjunctiva/sclera: Conjunctivae normal.  Neck:     Trachea: No tracheal deviation.  Cardiovascular:     Rate and Rhythm: Normal rate and regular rhythm.  Pulmonary:     Effort: Pulmonary effort is normal. No respiratory distress.     Breath sounds: No stridor.  Musculoskeletal:        General: Normal range of motion.  Skin:    General: Skin is warm and dry.     Findings: No rash.  Neurological:     Mental Status: She is alert.     Motor: No abnormal muscle tone.     Coordination: Coordination normal.  Psychiatric:        Behavior: Behavior normal.        Assessment & Plan:     ICD-10-CM   1. Weight gain  R63.5 Ambulatory referral to General Surgery   needs referral to [facility] bariatric clinic to est care locally     2. S/P gastric bypass  Z98.84 Ambulatory referral to General Surgery   [place]    3. Hyperlipidemia, unspecified hyperlipidemia type  E78.5     4. Current mild episode of major depressive disorder, unspecified whether recurrent (HCC)  F32.0 DULoxetine (CYMBALTA) 30 MG capsule   working well    5. Anxiety disorder, unspecified type  F41.9 DULoxetine (CYMBALTA) 30 MG capsule   sx improving    6. Class 2 severe obesity with serious comorbidity and body mass index (BMI) of 35.0 to 35.9 in adult, unspecified obesity type (HCC)  E66.01    Z68.35    weight increased, wants local f/up    7. B12 deficiency  E53.8    on shots at home, needs syringes and needles, is doing recheck of labs with specialists    8. Regurgitation of food  R11.10 Ambulatory referral to General Surgery   certain foods, with vomiting, fish, some problems with glutens, would like her pouch checked with local bariatric specialist    9. Epigastric pain  R10.13 Ambulatory referral to General Surgery   pain with eating certain foods s/p RNY        No follow-ups on file.   Danelle Berry, PA-C 01/22/21 3:43 PM

## 2021-01-28 ENCOUNTER — Encounter: Payer: Self-pay | Admitting: Family Medicine

## 2021-01-30 ENCOUNTER — Inpatient Hospital Stay: Payer: 59 | Attending: Internal Medicine

## 2021-01-30 ENCOUNTER — Other Ambulatory Visit: Payer: Self-pay

## 2021-01-30 ENCOUNTER — Other Ambulatory Visit: Payer: Self-pay | Admitting: *Deleted

## 2021-01-30 DIAGNOSIS — K9589 Other complications of other bariatric procedure: Secondary | ICD-10-CM

## 2021-01-30 DIAGNOSIS — D508 Other iron deficiency anemias: Secondary | ICD-10-CM | POA: Insufficient documentation

## 2021-01-30 LAB — CBC WITH DIFFERENTIAL/PLATELET
Abs Immature Granulocytes: 0.02 10*3/uL (ref 0.00–0.07)
Basophils Absolute: 0 10*3/uL (ref 0.0–0.1)
Basophils Relative: 0 %
Eosinophils Absolute: 0.1 10*3/uL (ref 0.0–0.5)
Eosinophils Relative: 1 %
HCT: 36.2 % (ref 36.0–46.0)
Hemoglobin: 12.3 g/dL (ref 12.0–15.0)
Immature Granulocytes: 0 %
Lymphocytes Relative: 37 %
Lymphs Abs: 2.7 10*3/uL (ref 0.7–4.0)
MCH: 29.3 pg (ref 26.0–34.0)
MCHC: 34 g/dL (ref 30.0–36.0)
MCV: 86.2 fL (ref 80.0–100.0)
Monocytes Absolute: 0.5 10*3/uL (ref 0.1–1.0)
Monocytes Relative: 7 %
Neutro Abs: 3.9 10*3/uL (ref 1.7–7.7)
Neutrophils Relative %: 55 %
Platelets: 267 10*3/uL (ref 150–400)
RBC: 4.2 MIL/uL (ref 3.87–5.11)
RDW: 12.5 % (ref 11.5–15.5)
WBC: 7.2 10*3/uL (ref 4.0–10.5)
nRBC: 0 % (ref 0.0–0.2)

## 2021-01-30 LAB — IRON AND TIBC
Iron: 54 ug/dL (ref 28–170)
Saturation Ratios: 15 % (ref 10.4–31.8)
TIBC: 361 ug/dL (ref 250–450)
UIBC: 307 ug/dL

## 2021-01-30 LAB — BASIC METABOLIC PANEL
Anion gap: 5 (ref 5–15)
BUN: 13 mg/dL (ref 6–20)
CO2: 23 mmol/L (ref 22–32)
Calcium: 8.8 mg/dL — ABNORMAL LOW (ref 8.9–10.3)
Chloride: 108 mmol/L (ref 98–111)
Creatinine, Ser: 0.71 mg/dL (ref 0.44–1.00)
GFR, Estimated: >60 mL/min (ref >60)
Glucose, Bld: 132 mg/dL — ABNORMAL HIGH (ref 70–99)
Potassium: 3.6 mmol/L (ref 3.5–5.1)
Sodium: 136 mmol/L (ref 135–145)

## 2021-01-30 LAB — FERRITIN: Ferritin: 25 ng/mL (ref 11–307)

## 2021-01-31 ENCOUNTER — Other Ambulatory Visit: Payer: 59

## 2021-02-04 ENCOUNTER — Ambulatory Visit: Payer: 59

## 2021-02-04 ENCOUNTER — Ambulatory Visit: Payer: 59 | Admitting: Internal Medicine

## 2021-02-07 ENCOUNTER — Telehealth: Payer: Self-pay | Admitting: Internal Medicine

## 2021-02-07 ENCOUNTER — Inpatient Hospital Stay: Payer: 59

## 2021-02-07 ENCOUNTER — Inpatient Hospital Stay: Payer: 59 | Admitting: Internal Medicine

## 2021-02-07 DIAGNOSIS — D508 Other iron deficiency anemias: Secondary | ICD-10-CM

## 2021-02-07 NOTE — Telephone Encounter (Signed)
Pt called to cancel her appt for today. Sick child. Please call back to reschdule at [phone]

## 2021-02-07 NOTE — Telephone Encounter (Signed)
Called pt sent team a chat

## 2021-02-07 NOTE — Telephone Encounter (Signed)
Patient notified of MD recommendations via MyChart.  Please schedule as MD advise.

## 2021-02-07 NOTE — Addendum Note (Signed)
Addended by: Guerry Minors on: 02/07/2021 02:26 PM   Modules accepted: Orders

## 2021-02-07 NOTE — Telephone Encounter (Signed)
MD recommendations:  Labs look good today.  Please schedule appointments to feb mid 2022- labs- cbc/bmp;-possible venofer-Thanks

## 2021-02-18 ENCOUNTER — Other Ambulatory Visit: Payer: Self-pay | Admitting: Surgery

## 2021-02-18 DIAGNOSIS — Z9884 Bariatric surgery status: Secondary | ICD-10-CM

## 2021-02-20 ENCOUNTER — Other Ambulatory Visit: Payer: Self-pay | Admitting: Surgery

## 2021-02-20 DIAGNOSIS — Z9884 Bariatric surgery status: Secondary | ICD-10-CM

## 2021-02-25 ENCOUNTER — Other Ambulatory Visit: Payer: Self-pay | Admitting: Surgery

## 2021-02-25 ENCOUNTER — Ambulatory Visit
Admission: RE | Admit: 2021-02-25 | Discharge: 2021-02-25 | Disposition: A | Discharge status: Home / Self Care | Payer: 59 | Source: Ambulatory Visit | Attending: Surgery | Admitting: Surgery

## 2021-02-25 DIAGNOSIS — Z9884 Bariatric surgery status: Secondary | ICD-10-CM

## 2021-04-10 ENCOUNTER — Ambulatory Visit: Payer: 59 | Admitting: Adult Health

## 2021-04-14 ENCOUNTER — Encounter: Payer: Self-pay | Admitting: Adult Health

## 2021-04-16 ENCOUNTER — Ambulatory Visit: Payer: 59 | Admitting: Adult Health

## 2021-04-18 ENCOUNTER — Inpatient Hospital Stay: Payer: 59 | Attending: Internal Medicine | Admitting: Internal Medicine

## 2021-04-18 ENCOUNTER — Inpatient Hospital Stay: Payer: 59

## 2021-04-24 ENCOUNTER — Other Ambulatory Visit: Payer: Self-pay

## 2021-04-24 ENCOUNTER — Ambulatory Visit: Payer: 59 | Admitting: Internal Medicine

## 2021-04-24 VITALS — BP 116/64 | HR 102 | Temp 98.1°F | Resp 16 | Ht 59.0 in | Wt 183.0 lb

## 2021-04-24 DIAGNOSIS — F419 Anxiety disorder, unspecified: Secondary | ICD-10-CM

## 2021-04-24 MED ORDER — DULOXETINE HCL 60 MG PO CPEP: 60.0000 mg | ORAL_CAPSULE | Freq: Every day | ORAL | 3 refills | Status: DC

## 2021-04-24 NOTE — Progress Notes (Signed)
Established Patient Office Visit  Subjective:  Patient ID: Sandra Munoz, female    DOB: 24-Sep-1980  Age: 41 y.o. MRN: [id number]  CC:  Chief Complaint  Patient presents with   Follow-up   Depression    Cymbalta increase    HPI Sandra Munoz presents for follow up on chronic medical conditions.   MDD: -Mood status: uncontrolled -Current treatment: Cymbalta 30, Hydroxyzine 25-50 PRN, using about twice a week  -Satisfied with current treatment?: no -Symptom severity: moderate  -Duration of current treatment :  3 months  -Side effects: no Medication compliance: excellent compliance Psychotherapy/counseling: no in the past Previous psychiatric medications: lexapro and zoloft Depressed mood: yes Anxious mood: yes Anhedonia: no Significant weight loss or gain: no Insomnia: no  both Fatigue: yes  Depression screen [facility] 2/9 04/24/2021 04/24/2021 01/22/2021 12/26/2020 12/26/2020  Decreased Interest 1 0 0 0 0  Down, Depressed, Hopeless 1 0 0 0 0  PHQ - 2 Score 2 0 0 0 0  Altered sleeping 2 0 0 0 -  Tired, decreased energy 2 0 0 0 -  Change in appetite 0 0 0 0 -  Feeling bad or failure about yourself  0 0 0 0 -  Trouble concentrating 0 0 0 0 -  Moving slowly or fidgety/restless 0 0 0 0 -  Suicidal thoughts 0 0 0 0 -  PHQ-9 Score 6 0 0 0 -  Difficult doing work/chores - Not difficult at all Not difficult at all - -  Some recent data might be hidden   ADHD: -Following with specialist -Currently on Guanfacine 2 mg  Past Medical History:  Diagnosis Date   Allergy    Depression    Polycystic ovarian syndrome     Past Surgical History:  Procedure Laterality Date   ABDOMINAL HYSTERECTOMY     GASTRIC BYPASS     REDUCTION MAMMAPLASTY Bilateral 2002    Family History  Problem Relation Age of Onset   Cancer Mother        SCC of skin/Involved throat   Autoimmune disease Mother    Breast cancer Neg Hx     Social History   Socioeconomic History   Marital status:  Married    Spouse name: Cristal Deer   Number of children: 1   Years of education: Not on file   Highest education level: Not on file  Occupational History   Not on file  Tobacco Use   Smoking status: Former    Types: Cigarettes    Quit date: 09/30/2009    Years since quitting: 11.5   Smokeless tobacco: Never  Vaping Use   Vaping Use: Never used  Substance and Sexual Activity   Alcohol use: Yes    Comment: ocasional   Drug use: Never   Sexual activity: Yes    Birth control/protection: Surgical  Other Topics Concern   Not on file  Social History Narrative   Moved from [place]; Accountant; 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    Social Determinants of Health   Financial Resource Strain: Low Risk    Difficulty of Paying Living Expenses: Not hard at all  Food Insecurity: No Food Insecurity   Worried About Programme researcher, broadcasting/film/video in the Last Year: Never true   Ran Out of Food in the Last Year: Never true  Transportation Needs: No Transportation Needs   Lack of Transportation (Medical): No   Lack of Transportation (Non-Medical): No  Physical Activity: Sufficiently Active   Days  of Exercise per Week: 4 days   Minutes of Exercise per Session: 50 min  Stress: No Stress Concern Present   Feeling of Stress : Only a little  Social Connections: Moderately Isolated   Frequency of Communication with Friends and Family: More than three times a week   Frequency of Social Gatherings with Friends and Family: Once a week   Attends Religious Services: Never   Database administrator or Organizations: No   Attends Engineer, structural: Never   Marital Status: Married  Catering manager Violence: Not At Risk   Fear of Current or Ex-Partner: No   Emotionally Abused: No   Physically Abused: No   Sexually Abused: No    Outpatient Medications Prior to Visit  Medication Sig Dispense Refill   cyanocobalamin (,VITAMIN B-12,) 1000 MCG/ML injection INJECT 1 ML INTO THE  SKIN INTRAMUSCULARLY ONCE EVERY MONTH 1 mL 11   DULoxetine (CYMBALTA) 30 MG capsule Take 1 capsule (30 mg total) by mouth daily. 90 capsule 3   guanFACINE (INTUNIV) 2 MG TB24 ER tablet Take 2 mg by mouth daily.     hydrOXYzine (VISTARIL) 25 MG capsule TAKE 1-2 CAPSULES (25-50 MG TOTAL) BY MOUTH 3 (THREE) TIMES DAILY AS NEEDED FOR ITCHING (HIVES). 180 capsule 1   No facility-administered medications prior to visit.    Allergies  Allergen Reactions   Corn-Containing Products Other (See Comments)    She says she is only occasionally sensitive to corn   Milk Protein Hives    ROS Review of Systems  Constitutional:  Negative for chills, fever and unexpected weight change.  Psychiatric/Behavioral:  Positive for sleep disturbance. The patient is nervous/anxious and is hyperactive.      Objective:    Physical Exam Constitutional:      Appearance: Normal appearance.  HENT:     Head: Normocephalic and atraumatic.  Eyes:     Conjunctiva/sclera: Conjunctivae normal.  Cardiovascular:     Rate and Rhythm: Normal rate and regular rhythm.  Pulmonary:     Effort: Pulmonary effort is normal.     Breath sounds: Normal breath sounds.  Musculoskeletal:     Right lower leg: No edema.     Left lower leg: No edema.  Skin:    General: Skin is warm and dry.  Neurological:     General: No focal deficit present.     Mental Status: She is alert. Mental status is at baseline.  Psychiatric:        Mood and Affect: Mood normal.        Behavior: Behavior normal.    BP 116/64    Pulse (!) 102    Temp 98.1 F (36.7 C)    Resp 16    Ht 4\' 11"  (1.499 m)    Wt 183 lb (83 kg)    SpO2 98%    BMI 36.96 kg/m  Wt Readings from Last 3 Encounters:  01/22/21 175 lb 3.2 oz (79.5 kg)  12/26/20 176 lb 9.6 oz (80.1 kg)  11/18/20 184 lb (83.5 kg)     Health Maintenance Due  Topic Date Due   COVID-19 Vaccine (5 - Booster for Pfizer series) 11/16/2020    There are no preventive care reminders to display for  this patient.  Lab Results  Component Value Date   TSH 0.411 (L) 05/21/2020   Lab Results  Component Value Date   WBC 7.2 01/30/2021   HGB 12.3 01/30/2021   HCT 36.2 01/30/2021   MCV 86.2  01/30/2021   PLT 267 01/30/2021   Lab Results  Component Value Date   NA 136 01/30/2021   K 3.6 01/30/2021   CO2 23 01/30/2021   GLUCOSE 132 (H) 01/30/2021   BUN 13 01/30/2021   CREATININE 0.71 01/30/2021   BILITOT 0.5 08/02/2020   ALKPHOS 35 (L) 08/02/2020   AST 20 08/02/2020   ALT 17 08/02/2020   PROT 6.5 08/02/2020   ALBUMIN 3.7 08/02/2020   CALCIUM 8.8 (L) 01/30/2021   ANIONGAP 5 01/30/2021   Lab Results  Component Value Date   CHOL 195 09/24/2020   Lab Results  Component Value Date   HDL 52 09/24/2020   Lab Results  Component Value Date   LDLCALC 122 (H) 09/24/2020   Lab Results  Component Value Date   TRIG 106 09/24/2020   Lab Results  Component Value Date   CHOLHDL 3.8 09/24/2020   Lab Results  Component Value Date   HGBA1C 5.3 12/26/2020      Assessment & Plan:   Problem List Items Addressed This Visit       Other   Anxiety disorder - Primary    Increase Cymbalta to 60 mg daily, follow up in 2 months for recheck.      Relevant Medications   DULoxetine (CYMBALTA) 60 MG capsule    Meds ordered this encounter  Medications   DULoxetine (CYMBALTA) 60 MG capsule    Sig: Take 1 capsule (60 mg total) by mouth daily.    Dispense:  90 capsule    Refill:  3    Follow-up: Return in about 2 months (around 06/22/2021).    Margarita Mail, DO

## 2021-04-24 NOTE — Assessment & Plan Note (Signed)
Increase Cymbalta to 60 mg daily, follow up in 2 months for recheck.

## 2021-04-24 NOTE — Patient Instructions (Addendum)
It was great seeing you today!  Plan discussed at today's visit: -Cymbalta increased to 60 mg   Follow up in: 2 months   Take care and let us know if you have any questions or concerns prior to your next visit.  Dr. Caralee Ates

## 2021-06-05 ENCOUNTER — Ambulatory Visit: Payer: 59 | Admitting: Internal Medicine

## 2021-06-05 ENCOUNTER — Encounter: Payer: Self-pay | Admitting: Internal Medicine

## 2021-06-05 VITALS — BP 122/84 | HR 90 | Temp 97.8°F | Resp 16 | Ht 59.0 in | Wt 181.2 lb

## 2021-06-05 DIAGNOSIS — Z9884 Bariatric surgery status: Secondary | ICD-10-CM | POA: Diagnosis not present

## 2021-06-05 DIAGNOSIS — R635 Abnormal weight gain: Secondary | ICD-10-CM

## 2021-06-05 NOTE — Patient Instructions (Addendum)
It was great seeing you today! ? ?Plan discussed at today's visit: ?-Referral to Nutrition placed today ? ?Follow up in: already scheduled on 4/26 ? ?Take care and let us know if you have any questions or concerns prior to your next visit. ? ?Dr. Caralee Ates ? ?

## 2021-06-05 NOTE — Progress Notes (Signed)
? ?Established Patient Office Visit ? ?Subjective:  ?Patient ID: Sandra Munoz, female    DOB: 07-Feb-1981  Age: 41 y.o. MRN: [id number] ? ?CC:  ?Chief Complaint  ?Patient presents with  ? Referral  ?  To nutritionist ? ?  ? ? ?HPI ?Sandra Munoz presents for referral to nutritional services. She did undergo a Roux-en-Y in 2016. She has gained about 50 pounds since 2020. She also has a history of gestational diabetes and PCOS.  ? ?Diet: breakfast - greek yogurt and granola, coffee (working to change to sugar free creamer).  ?Exercise: started back to exercising 45 minutes of circuit training 3 x a week  ? ?Past Medical History:  ?Diagnosis Date  ? Allergy   ? Depression   ? Polycystic ovarian syndrome   ? ? ?Past Surgical History:  ?Procedure Laterality Date  ? ABDOMINAL HYSTERECTOMY    ? GASTRIC BYPASS    ? REDUCTION MAMMAPLASTY Bilateral 2002  ? ? ?Family History  ?Problem Relation Age of Onset  ? Cancer Mother   ?     SCC of skin/Involved throat  ? Autoimmune disease Mother   ? Breast cancer Neg Hx   ? ? ?Social History  ? ?Socioeconomic History  ? Marital status: Married  ?  Spouse name: Cristal Deer  ? Number of children: 1  ? Years of education: Not on file  ? Highest education level: Not on file  ?Occupational History  ? Not on file  ?Tobacco Use  ? Smoking status: Former  ?  Types: Cigarettes  ?  Quit date: 09/30/2009  ?  Years since quitting: 11.6  ? Smokeless tobacco: Never  ?Vaping Use  ? Vaping Use: Never used  ?Substance and Sexual Activity  ? Alcohol use: Yes  ?  Comment: ocasional  ? Drug use: Never  ? Sexual activity: Yes  ?  Birth control/protection: Surgical  ?Other Topics Concern  ? Not on file  ?Social History Narrative  ? 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   ? ?Social Determinants of Health  ? ?Financial Resource Strain: Low Risk   ? Difficulty of Paying Living Expenses: Not hard at all  ?Food Insecurity: No Food Insecurity  ? Worried About Community education officer in the Last Year: Never true  ? Ran Out of Food in the Last Year: Never true  ?Transportation Needs: No Transportation Needs  ? Lack of Transportation (Medical): No  ? Lack of Transportation (Non-Medical): No  ?Physical Activity: Sufficiently Active  ? Days of Exercise per Week: 4 days  ? Minutes of Exercise per Session: 50 min  ?Stress: No Stress Concern Present  ? Feeling of Stress : Only a little  ?Social Connections: Moderately Isolated  ? Frequency of Communication with Friends and Family: More than three times a week  ? Frequency of Social Gatherings with Friends and Family: Once a week  ? Attends Religious Services: Never  ? Active Member of Clubs or Organizations: No  ? Attends Banker Meetings: Never  ? Marital Status: Married  ?Intimate Partner Violence: Not At Risk  ? Fear of Current or Ex-Partner: No  ? Emotionally Abused: No  ? Physically Abused: No  ? Sexually Abused: No  ? ? ?Outpatient Medications Prior to Visit  ?Medication Sig Dispense Refill  ? cyanocobalamin (,VITAMIN B-12,) 1000 MCG/ML injection INJECT 1 ML INTO THE SKIN INTRAMUSCULARLY ONCE EVERY MONTH 1 mL 11  ? DULoxetine (CYMBALTA) 60 MG capsule Take  1 capsule (60 mg total) by mouth daily. 90 capsule 3  ? guanFACINE (INTUNIV) 2 MG TB24 ER tablet Take 2 mg by mouth daily.    ? hydrOXYzine (VISTARIL) 25 MG capsule TAKE 1-2 CAPSULES (25-50 MG TOTAL) BY MOUTH 3 (THREE) TIMES DAILY AS NEEDED FOR ITCHING (HIVES). 180 capsule 1  ? ?No facility-administered medications prior to visit.  ? ? ?Allergies  ?Allergen Reactions  ? Corn-Containing Products Other (See Comments)  ?  She says she is only occasionally sensitive to corn  ? Milk Protein Hives  ? ? ?ROS ?Review of Systems  ?All other systems reviewed and are negative. ? ?  ?Objective:  ?  ?Physical Exam ?Constitutional:   ?   Appearance: Normal appearance.  ?HENT:  ?   Head: Normocephalic and atraumatic.  ?Eyes:  ?   Conjunctiva/sclera: Conjunctivae normal.   ?Cardiovascular:  ?   Rate and Rhythm: Normal rate and regular rhythm.  ?Pulmonary:  ?   Effort: Pulmonary effort is normal.  ?   Breath sounds: Normal breath sounds.  ?Skin: ?   General: Skin is warm and dry.  ?Neurological:  ?   General: No focal deficit present.  ?   Mental Status: She is alert. Mental status is at baseline.  ?Psychiatric:     ?   Mood and Affect: Mood normal.     ?   Behavior: Behavior normal.  ? ? ?BP 122/84   Pulse 90   Temp 97.8 ?F (36.6 ?C)   Resp 16   Ht 4\' 11"  (1.499 m)   Wt 181 lb 3.2 oz (82.2 kg)   SpO2 98%   BMI 36.60 kg/m?  ?Wt Readings from Last 3 Encounters:  ?04/24/21 183 lb (83 kg)  ?01/22/21 175 lb 3.2 oz (79.5 kg)  ?12/26/20 176 lb 9.6 oz (80.1 kg)  ? ? ? ?Health Maintenance Due  ?Topic Date Due  ? COVID-19 Vaccine (5 - Booster for Pfizer series) 11/16/2020  ? MAMMOGRAM  05/09/2021  ? ? ?There are no preventive care reminders to display for this patient. ? ?Lab Results  ?Component Value Date  ? TSH 0.411 (L) 05/21/2020  ? ?Lab Results  ?Component Value Date  ? WBC 7.2 01/30/2021  ? HGB 12.3 01/30/2021  ? HCT 36.2 01/30/2021  ? MCV 86.2 01/30/2021  ? PLT 267 01/30/2021  ? ?Lab Results  ?Component Value Date  ? NA 136 01/30/2021  ? K 3.6 01/30/2021  ? CO2 23 01/30/2021  ? GLUCOSE 132 (H) 01/30/2021  ? BUN 13 01/30/2021  ? CREATININE 0.71 01/30/2021  ? BILITOT 0.5 08/02/2020  ? ALKPHOS 35 (L) 08/02/2020  ? AST 20 08/02/2020  ? ALT 17 08/02/2020  ? PROT 6.5 08/02/2020  ? ALBUMIN 3.7 08/02/2020  ? CALCIUM 8.8 (L) 01/30/2021  ? ANIONGAP 5 01/30/2021  ? ?Lab Results  ?Component Value Date  ? CHOL 195 09/24/2020  ? ?Lab Results  ?Component Value Date  ? HDL 52 09/24/2020  ? ?Lab Results  ?Component Value Date  ? LDLCALC 122 (H) 09/24/2020  ? ?Lab Results  ?Component Value Date  ? TRIG 106 09/24/2020  ? ?Lab Results  ?Component Value Date  ? CHOLHDL 3.8 09/24/2020  ? ?Lab Results  ?Component Value Date  ? HGBA1C 5.3 12/26/2020  ? ? ?  ?Assessment & Plan:  ? ?1. Weight gain/History  of Roux-en-Y gastric bypass: Counseled on calorie deficit and exercise and other lifestyle changes. Referral placed to nutrition ?.  ?- Amb ref to  Medical Nutrition Therapy-MNT ? ?Follow-up: Return if symptoms worsen or fail to improve, for already scheduled .  ? ? ?Margarita MailElisabeth Zia Kanner, DO ?

## 2021-06-25 ENCOUNTER — Ambulatory Visit: Payer: 59 | Admitting: Internal Medicine

## 2021-06-25 ENCOUNTER — Encounter: Payer: Self-pay | Admitting: Internal Medicine

## 2021-06-25 VITALS — BP 122/76 | HR 89 | Resp 16 | Ht 59.0 in | Wt 179.0 lb

## 2021-06-25 DIAGNOSIS — E782 Mixed hyperlipidemia: Secondary | ICD-10-CM | POA: Diagnosis not present

## 2021-06-25 DIAGNOSIS — R635 Abnormal weight gain: Secondary | ICD-10-CM | POA: Diagnosis not present

## 2021-06-25 DIAGNOSIS — F330 Major depressive disorder, recurrent, mild: Secondary | ICD-10-CM

## 2021-06-25 NOTE — Progress Notes (Signed)
? ?Established Patient Office Visit ? ?Subjective:  ?Patient ID: Sandra Munoz, female    DOB: Oct 13, 1980  Age: 41 y.o. MRN: [id number] ? ?CC:  ?Chief Complaint  ?Patient presents with  ? Follow-up  ? ? ?HPI ?Sandra Munoz presents for follow up on chronic medical conditions.  ? ?MDD: ?-Mood status: uncontrolled ?-Current treatment: Cymbalta 60 (increased at LOV), Hydroxyzine 25-50 PRN, using about twice a week (takes at night, trying to wean down)  ?-Satisfied with current treatment?: yes - doing much better since increasing the medication, having some stress at work recently but has been handling everything well  ?-Symptom severity: moderate  ?-Duration of current treatment : 2 months  ?-Side effects: no; slight sluggishness in the mornings but this is tolerable  ?Medication compliance: excellent compliance ?Psychotherapy/counseling: no in the past ?Previous psychiatric medications: lexapro and zoloft ?Depressed mood: yes ?Anxious mood: yes ?Anhedonia: no ?Significant weight loss or gain: no ?Insomnia: no  ?Fatigue: yes ? ? ?  06/25/2021  ?  8:01 AM 06/05/2021  ?  2:42 PM 04/24/2021  ?  2:39 PM 04/24/2021  ?  2:32 PM 01/22/2021  ?  3:33 PM  ?Depression screen PHQ 2/9  ?Decreased Interest 0 0 1 0 0  ?Down, Depressed, Hopeless 0 0 1 0 0  ?PHQ - 2 Score 0 0 2 0 0  ?Altered sleeping 0 0 2 0 0  ?Tired, decreased energy 0 0 2 0 0  ?Change in appetite 0 0 0 0 0  ?Feeling bad or failure about yourself  0 0 0 0 0  ?Trouble concentrating 0 0 0 0 0  ?Moving slowly or fidgety/restless 0 0 0 0 0  ?Suicidal thoughts 0 0 0 0 0  ?PHQ-9 Score 0 0 6 0 0  ?Difficult doing work/chores  Not difficult at all  Not difficult at all Not difficult at all  ? ?ADHD: ?-Following with specialist ?-Currently on Guanfacine 2 mg, no issues with this ? ?HLD: ?-Medications: None ?-Last lipid panel: Lipid Panel  ?   ?Component Value Date/Time  ? CHOL 195 09/24/2020 0845  ? TRIG 106 09/24/2020 0845  ? HDL 52 09/24/2020 0845  ? CHOLHDL 3.8 09/24/2020 0845   ? LDLCALC 122 (H) 09/24/2020 0845  ? ?The 10-year ASCVD risk score (Arnett DK, et al., 2019) is: 0.6% ?  Values used to calculate the score: ?    Age: 36 years ?    Sex: Female ?    Is Non-Hispanic African American: No ?    Diabetic: No ?    Tobacco smoker: No ?    Systolic Blood Pressure: 122 mmHg ?    Is BP treated: No ?    HDL Cholesterol: 52 mg/dL ?    Total Cholesterol: 195 mg/dL ? ?Pre-Diabetes: ?-Last A1c here 5.3 in 10/22, had a physical at work and her A1c  was 5.9%. She is now working out at a gym doing cardio and [employer] and working with a trainer there. She has made dietary changes and is eating lower carbs - only eating long grain brown rice and focusing more on proteins.  ?-At our last visit we discussed a nutrition referral, she is scheduled to see them in 3 weeks ? ?Health Maintenance: ?-Blood work UTD ?-Mammogram 3/22 - CPE scheduled in July, will plan mammogram around that time  ? ?Past Medical History:  ?Diagnosis Date  ? Allergy   ? Depression   ? Polycystic ovarian syndrome   ? ? ?Past Surgical History:  ?  Procedure Laterality Date  ? ABDOMINAL HYSTERECTOMY    ? GASTRIC BYPASS    ? REDUCTION MAMMAPLASTY Bilateral 2002  ? ? ?Family History  ?Problem Relation Age of Onset  ? Cancer Mother   ?     SCC of skin/Involved throat  ? Autoimmune disease Mother   ? Breast cancer Neg Hx   ? ? ?Social History  ? ?Socioeconomic History  ? Marital status: Married  ?  Spouse name: Cristal Deer  ? Number of children: 1  ? Years of education: Not on file  ? Highest education level: Not on file  ?Occupational History  ? Not on file  ?Tobacco Use  ? Smoking status: Former  ?  Types: Cigarettes  ?  Quit date: 09/30/2009  ?  Years since quitting: 11.7  ? Smokeless tobacco: Never  ?Vaping Use  ? Vaping Use: Never used  ?Substance and Sexual Activity  ? Alcohol use: Yes  ?  Comment: ocasional  ? Drug use: Never  ? Sexual activity: Yes  ?  Birth control/protection: Surgical  ?Other Topics Concern  ? Not on file  ?Social  History Narrative  ? 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   ? ?Social Determinants of Health  ? ?Financial Resource Strain: Low Risk   ? Difficulty of Paying Living Expenses: Not hard at all  ?Food Insecurity: No Food Insecurity  ? Worried About Programme researcher, broadcasting/film/video in the Last Year: Never true  ? Ran Out of Food in the Last Year: Never true  ?Transportation Needs: No Transportation Needs  ? Lack of Transportation (Medical): No  ? Lack of Transportation (Non-Medical): No  ?Physical Activity: Sufficiently Active  ? Days of Exercise per Week: 4 days  ? Minutes of Exercise per Session: 50 min  ?Stress: No Stress Concern Present  ? Feeling of Stress : Only a little  ?Social Connections: Moderately Isolated  ? Frequency of Communication with Friends and Family: More than three times a week  ? Frequency of Social Gatherings with Friends and Family: Once a week  ? Attends Religious Services: Never  ? Active Member of Clubs or Organizations: No  ? Attends Banker Meetings: Never  ? Marital Status: Married  ?Intimate Partner Violence: Not At Risk  ? Fear of Current or Ex-Partner: No  ? Emotionally Abused: No  ? Physically Abused: No  ? Sexually Abused: No  ? ? ?Outpatient Medications Prior to Visit  ?Medication Sig Dispense Refill  ? cyanocobalamin (,VITAMIN B-12,) 1000 MCG/ML injection INJECT 1 ML INTO THE SKIN INTRAMUSCULARLY ONCE EVERY MONTH 1 mL 11  ? DULoxetine (CYMBALTA) 60 MG capsule Take 1 capsule (60 mg total) by mouth daily. 90 capsule 3  ? guanFACINE (INTUNIV) 2 MG TB24 ER tablet Take 2 mg by mouth daily.    ? hydrOXYzine (VISTARIL) 25 MG capsule TAKE 1-2 CAPSULES (25-50 MG TOTAL) BY MOUTH 3 (THREE) TIMES DAILY AS NEEDED FOR ITCHING (HIVES). 180 capsule 1  ? ?No facility-administered medications prior to visit.  ? ? ?Allergies  ?Allergen Reactions  ? Corn-Containing Products Other (See Comments)  ?  She says she is only occasionally sensitive to corn   ? Milk Protein Hives  ? ? ?ROS ?Review of Systems  ?Constitutional:  Negative for chills, fever and unexpected weight change.  ?Psychiatric/Behavioral:  Negative for sleep disturbance. The patient is not nervous/anxious and is not hyperactive.   ? ?  ?Objective:  ?  ?Physical Exam ?Constitutional:   ?  Appearance: Normal appearance.  ?HENT:  ?   Head: Normocephalic and atraumatic.  ?Eyes:  ?   Conjunctiva/sclera: Conjunctivae normal.  ?Cardiovascular:  ?   Rate and Rhythm: Normal rate and regular rhythm.  ?Pulmonary:  ?   Effort: Pulmonary effort is normal.  ?   Breath sounds: Normal breath sounds.  ?Skin: ?   General: Skin is warm and dry.  ?Neurological:  ?   General: No focal deficit present.  ?   Mental Status: She is alert. Mental status is at baseline.  ?Psychiatric:     ?   Mood and Affect: Mood normal.     ?   Behavior: Behavior normal.  ? ? ?BP 122/76   Pulse 89   Resp 16   Ht 4\' 11"  (1.499 m)   Wt 179 lb (81.2 kg)   SpO2 99%   BMI 36.15 kg/m?  ?Wt Readings from Last 3 Encounters:  ?06/25/21 179 lb (81.2 kg)  ?06/05/21 181 lb 3.2 oz (82.2 kg)  ?04/24/21 183 lb (83 kg)  ? ? ? ?Health Maintenance Due  ?Topic Date Due  ? COVID-19 Vaccine (5 - Booster for Pfizer series) 11/16/2020  ? MAMMOGRAM  05/09/2021  ? ? ?There are no preventive care reminders to display for this patient. ? ?Lab Results  ?Component Value Date  ? TSH 0.411 (L) 05/21/2020  ? ?Lab Results  ?Component Value Date  ? WBC 7.2 01/30/2021  ? HGB 12.3 01/30/2021  ? HCT 36.2 01/30/2021  ? MCV 86.2 01/30/2021  ? PLT 267 01/30/2021  ? ?Lab Results  ?Component Value Date  ? NA 136 01/30/2021  ? K 3.6 01/30/2021  ? CO2 23 01/30/2021  ? GLUCOSE 132 (H) 01/30/2021  ? BUN 13 01/30/2021  ? CREATININE 0.71 01/30/2021  ? BILITOT 0.5 08/02/2020  ? ALKPHOS 35 (L) 08/02/2020  ? AST 20 08/02/2020  ? ALT 17 08/02/2020  ? PROT 6.5 08/02/2020  ? ALBUMIN 3.7 08/02/2020  ? CALCIUM 8.8 (L) 01/30/2021  ? ANIONGAP 5 01/30/2021  ? ?Lab Results  ?Component Value  Date  ? CHOL 195 09/24/2020  ? ?Lab Results  ?Component Value Date  ? HDL 52 09/24/2020  ? ?Lab Results  ?Component Value Date  ? LDLCALC 122 (H) 09/24/2020  ? ?Lab Results  ?Component Value Date  ? TRIG 106 07/2

## 2021-06-25 NOTE — Patient Instructions (Signed)
It was great seeing you today! ? ?Plan discussed at today's visit: ?-No medication changes made today, keep up the good work! ? ?Follow up in: July for CPE ? ?Take care and let us know if you have any questions or concerns prior to your next visit. ? ?Dr. Caralee Ates ? ?

## 2021-06-25 NOTE — Assessment & Plan Note (Signed)
Making lifestyle changes.  ?

## 2021-06-25 NOTE — Assessment & Plan Note (Signed)
Working on lifestyle management, trying to lose weight and changing diet, increasing exercise and working with a Psychologist, educational. Will be seeing nutritionist in a few weeks as well. Plan to recheck labs at physical.  ?

## 2021-06-25 NOTE — Assessment & Plan Note (Signed)
Stable, doing well on Cymbalta 60 mg. Trying to wean down on the Hydroxyzine.  ?

## 2021-07-09 ENCOUNTER — Encounter: Payer: 59 | Attending: Family Medicine | Admitting: Dietician

## 2021-07-09 ENCOUNTER — Encounter: Payer: Self-pay | Admitting: Dietician

## 2021-07-09 VITALS — Ht 59.0 in | Wt 183.0 lb

## 2021-07-09 DIAGNOSIS — Z713 Dietary counseling and surveillance: Secondary | ICD-10-CM | POA: Insufficient documentation

## 2021-07-09 DIAGNOSIS — R635 Abnormal weight gain: Secondary | ICD-10-CM | POA: Insufficient documentation

## 2021-07-09 DIAGNOSIS — Z9884 Bariatric surgery status: Secondary | ICD-10-CM | POA: Insufficient documentation

## 2021-07-09 DIAGNOSIS — E669 Obesity, unspecified: Secondary | ICD-10-CM

## 2021-07-09 DIAGNOSIS — Z6836 Body mass index (BMI) 36.0-36.9, adult: Secondary | ICD-10-CM | POA: Diagnosis not present

## 2021-07-09 NOTE — Progress Notes (Signed)
Medical Nutrition Therapy: Visit start time: 1600  end time: 1700  ?Assessment:  Diagnosis: weight gain with history of gastric bypass surgery ?Past medical history: PCOS, partial hysterectomy 2019 ?Psychosocial issues/ stress concerns: history of anxiety and depression, ADHD ? ?Preferred learning method:  ?Auditory ?Hands-on ? ?Current weight: 183.0lbs with shoes    Height: 4'11"     BMI: 36.96 ? ?Medications, supplements: reconciled list in medical record ? ?Progress and evaluation:  ?HbA1C 5.3 (12/26/20); 5.7 at recent screening at work; she is concerned with preventing further increase. ?Lost over 100lbs after surgery, began weight regain during COVID and after hysterectomy. ?Feels best at about 130lbs.  ?Has been tracking food and fluid intake, app calculation recommended 1400kcal daily but she is having difficulty achieving that level due to fulness.  She is averaging 40% carb, 30% protein, and 30% fat. ?She reports growing weary of eating animal proteins and would like to incorporate some vegetarian proteins. ?She has been taking college classes and will be completing final exams and graduating this month. ? ?Physical activity: cardio + strength training 45-60 minutes, 4-6 times per week ? ?Dietary Intake:  ?Usual eating pattern includes 3 meals and 2 snacks per day. ?Dining out frequency: 1 meals per week. ? ?Breakfast: 07/08/21-- 2 boiled eggs, coffee with sf cocoa mix, unsweetened almond milk ?Snack: protein shake (inspire brand with 12oz unsweetened almond milk) ?Lunch: 1/2c brown rice, 4oz grilled chicken, 1/4c enchilada sauce ?Snack: oikos vanilla Greek yogurt with 1/2 cup berries ?Supper: 4oz chicken, mixed green salad with vinegar, 1/2 bagel thin toasted ?Snack: none ?Beverages: 80-120oz water, coffee in am, protein shake ? ?Intervention:  ? ?Nutrition Care Education: ?  ?Basic nutrition: appropriate nutrient balance for post-bariatric surgery; discussed need for vitamin supplementation lifelong after  bariatric surgery ?Weight control: estimated energy needs for weight loss at 1200-1300kcal, with maintaining current balance of macronutrients; discussed non-food factors that could be influencing weight ie stress, hormonal changes, recent increase in strength training ?Diabetes prevention:  appropriate meal and snack schedule, healthy carb choices ?Protein sources: provided list of vegetarian proteins and discussed carbohydrate content in some vegetarian protein foods. ? ? ?Nutritional Diagnosis:  Superior-3.3 Overweight/obesity As related to history of metabolic issues, history of excess calories, history of inadequate physical activity.  As evidenced by patient with current BMI of 37, resuming low carb eating pattern and regular exercise due to weight gain 7 years after bariatric surgery. ? ? ?Education Materials given:  ?Rebounding from [employer] (AND) ?Vegetarian Proteins ?Visit summary with goals/ instructions to be viewed in MyChart ? ? ?Learner/ who was taught:  ?Patient  ? ? ?Level of understanding: ?Verbalizes/ demonstrates competency ? ? ?Demonstrated degree of understanding via:   Teach back ?Learning barriers: ?None ? ?Willingness to learn/ readiness for change: ?Eager, change in progress ? ? ?Monitoring and Evaluation:  Dietary intake, exercise, and body weight ?     follow up: in 4 to 6 weeks, patient to schedule ? ?

## 2021-07-09 NOTE — Patient Instructions (Signed)
Continue with current eating pattern, macro balance and food choices.  ?Restart multivitamin to avoid other nutrition deficiencies, especially if calorie intake is below 1200 calorie average.  ?Keep up regular exercise, it will result in positive change! ?

## 2021-07-23 ENCOUNTER — Encounter: Payer: Self-pay | Admitting: Internal Medicine

## 2021-09-11 DIAGNOSIS — M9901 Segmental and somatic dysfunction of cervical region: Secondary | ICD-10-CM | POA: Diagnosis not present

## 2021-09-11 DIAGNOSIS — M62830 Muscle spasm of back: Secondary | ICD-10-CM | POA: Diagnosis not present

## 2021-09-11 DIAGNOSIS — R519 Headache, unspecified: Secondary | ICD-10-CM | POA: Diagnosis not present

## 2021-09-11 DIAGNOSIS — M5412 Radiculopathy, cervical region: Secondary | ICD-10-CM | POA: Diagnosis not present

## 2021-09-23 ENCOUNTER — Encounter: Payer: Self-pay | Admitting: Internal Medicine

## 2021-09-26 ENCOUNTER — Encounter: Payer: 59 | Admitting: Internal Medicine

## 2021-09-29 ENCOUNTER — Encounter: Payer: Self-pay | Admitting: Internal Medicine

## 2021-09-30 ENCOUNTER — Encounter: Payer: Self-pay | Admitting: Internal Medicine

## 2021-09-30 ENCOUNTER — Ambulatory Visit (INDEPENDENT_AMBULATORY_CARE_PROVIDER_SITE_OTHER): Payer: 59 | Admitting: Internal Medicine

## 2021-09-30 VITALS — BP 122/80 | HR 92 | Temp 98.3°F | Resp 16 | Ht 59.0 in | Wt 183.2 lb

## 2021-09-30 DIAGNOSIS — Z0000 Encounter for general adult medical examination without abnormal findings: Secondary | ICD-10-CM

## 2021-09-30 DIAGNOSIS — E88819 Insulin resistance, unspecified: Secondary | ICD-10-CM | POA: Insufficient documentation

## 2021-09-30 DIAGNOSIS — Z1231 Encounter for screening mammogram for malignant neoplasm of breast: Secondary | ICD-10-CM | POA: Diagnosis not present

## 2021-09-30 DIAGNOSIS — E8881 Metabolic syndrome: Secondary | ICD-10-CM

## 2021-09-30 NOTE — Progress Notes (Signed)
Name: Sandra Munoz   MRN: [id number]    DOB: 07/09/1980   Date:09/30/2021       Progress Note  Subjective  Chief Complaint  Chief Complaint  Patient presents with   Annual Exam    HPI  Patient presents for annual CPE.  Diet: avoiding sugar, decreasing carbs  Exercise: Working out at [employer] doing cardio and [employer] and working with a Clinical research associate.  Had been referred to nutrition, who was not particularly helpful.  The patient does have documented insulin resistance and PCOS.  She would be a good candidate for GLP-1 agonists whenever the starting dose becomes available.  No personal or family history of medullary thyroid carcinoma or MEN syndromes.  No history of pancreatitis.  [place] Office Visit from 12/26/2020 in [facility]  AUDIT-C Score 0      Depression: Phq 9 is  negative    09/30/2021    3:46 PM 07/09/2021    4:15 PM 06/25/2021    8:01 AM 06/05/2021    2:42 PM 04/24/2021    2:39 PM  Depression screen PHQ 2/9  Decreased Interest 0 0 0 0 1  Down, Depressed, Hopeless 0 1 0 0 1  PHQ - 2 Score 0 1 0 0 2  Altered sleeping 0  0 0 2  Tired, decreased energy 0  0 0 2  Change in appetite 0  0 0 0  Feeling bad or failure about yourself  0  0 0 0  Trouble concentrating 0  0 0 0  Moving slowly or fidgety/restless 0  0 0 0  Suicidal thoughts 0  0 0 0  PHQ-9 Score 0  0 0 6  Difficult doing work/chores Not difficult at all   Not difficult at all    Hypertension: BP Readings from Last 3 Encounters:  09/30/21 122/80  06/25/21 122/76  06/05/21 122/84   Obesity: Wt Readings from Last 3 Encounters:  09/30/21 183 lb 3.2 oz (83.1 kg)  07/09/21 183 lb (83 kg)  06/25/21 179 lb (81.2 kg)   BMI Readings from Last 3 Encounters:  09/30/21 37.00 kg/m  07/09/21 36.96 kg/m  06/25/21 36.15 kg/m     Vaccines:   HPV: n/a Tdap: Td booster 05/2017 Shingrix: N/A Pneumonia: N/A Flu: 10/22, up-to-date COVID-19: 5 vaccines total   Hep C Screening: 08/2018 STD  testing and prevention (HIV/chl/gon/syphilis): no concerns   Intimate partner violence: yesnegative screen  Incontinence Symptoms: negative for symptoms   Breast cancer:  - Last Mammogram: 3/22, BI-RADS 1, due - BRCA gene screening: n/a  Osteoporosis Prevention : Discussed high calcium and vitamin D supplementation, weight bearing exercises Bone density :not applicable   Cervical cancer screening: History of hysterectomy with removal of cervix 2019 secondary to large fibroid.  Skin cancer: Discussed monitoring for atypical lesions. Follows with Dermatology yearly  Colorectal cancer: N/a, no family history.  We will start routine screening at age 17. Lung cancer:  Low Dose CT Chest recommended if Age 77-80 years, 20 pack-year currently smoking OR have quit w/in 15years. Patient does not qualify for screen    Advanced Care Planning: A voluntary discussion about advance care planning including the explanation and discussion of advance directives.  Discussed health care proxy and Living will, and the patient was able to identify a health care proxy as husband Markelle Asaro.  Patient does not have a living will and power of attorney of health care   Lipids: Lab Results  Component Value Date  CHOL 195 09/24/2020   CHOL 176 09/20/2019   CHOL 195 09/09/2018   Lab Results  Component Value Date   HDL 52 09/24/2020   HDL 50 09/20/2019   HDL 55 09/09/2018   Lab Results  Component Value Date   LDLCALC 122 (H) 09/24/2020   LDLCALC 111 (H) 09/20/2019   LDLCALC 124 (H) 09/09/2018   Lab Results  Component Value Date   TRIG 106 09/24/2020   TRIG 61 09/20/2019   TRIG 69 09/09/2018   Lab Results  Component Value Date   CHOLHDL 3.8 09/24/2020   CHOLHDL 3.5 09/20/2019   CHOLHDL 3.5 09/09/2018   No results found for: "LDLDIRECT"  Glucose: Glucose, Bld  Date Value Ref Range Status  01/30/2021 132 (H) 70 - 99 mg/dL Final    Comment:    Glucose reference range applies only to  samples taken after fasting for at least 8 hours.  08/02/2020 103 (H) 70 - 99 mg/dL Final    Comment:    Glucose reference range applies only to samples taken after fasting for at least 8 hours.  09/20/2019 75 65 - 99 mg/dL Final    Comment:    .            Fasting reference interval .     Patient Active Problem List   Diagnosis Date Noted   Weight gain 06/25/2021   Vitamin D deficiency 09/20/2019   Hyperlipidemia 09/20/2019   Current mild episode of major depressive disorder ([place]) 09/20/2019   Insomnia due to other mental disorder 09/20/2019   S/P gastric bypass 09/20/2019   B12 deficiency 09/20/2019   Iron deficiency anemia following bariatric surgery 09/20/2019   History of OCD (obsessive compulsive disorder) 08/30/2018   Anxiety disorder 08/30/2018   Hx of cold sores 08/30/2018   Urticaria of unknown origin 02/13/2013    Past Surgical History:  Procedure Laterality Date   ABDOMINAL HYSTERECTOMY     GASTRIC BYPASS     REDUCTION MAMMAPLASTY Bilateral 2002    Family History  Problem Relation Age of Onset   Cancer Mother        SCC of skin/Involved throat   Autoimmune disease Mother    Breast cancer Neg Hx     Social History   Socioeconomic History   Marital status: Married    Spouse name: Harrell Gave   Number of children: 1   Years of education: Not on file   Highest education level: Not on file  Occupational History   Not on file  Tobacco Use   Smoking status: Former    Types: Cigarettes    Quit date: 09/30/2009    Years since quitting: 12.0   Smokeless tobacco: Never  Vaping Use   Vaping Use: Never used  Substance and Sexual Activity   Alcohol use: Not Currently   Drug use: Never   Sexual activity: Yes    Birth control/protection: Surgical  Other Topics Concern   Not on file  Social History Narrative   Moved from [place]; Accountant; 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    Social Determinants of Health   Financial Resource  Strain: Low Risk  (09/30/2021)   Overall Financial Resource Strain (CARDIA)    Difficulty of Paying Living Expenses: Not hard at all  Food Insecurity: No Food Insecurity (09/30/2021)   Hunger Vital Sign    Worried About Running Out of Food in the Last Year: Never true    Ran Out of Food in the Last  Year: Never true  Transportation Needs: No Transportation Needs (09/30/2021)   PRAPARE - Hydrologist (Medical): No    Lack of Transportation (Non-Medical): No  Physical Activity: Insufficiently Active (09/30/2021)   Exercise Vital Sign    Days of Exercise per Week: 3 days    Minutes of Exercise per Session: 30 min  Stress: Stress Concern Present (09/30/2021)   [place]    Feeling of Stress : To some extent  Social Connections: Moderately Integrated (09/30/2021)   Social Connection and Isolation Panel [NHANES]    Frequency of Communication with Friends and Family: More than three times a week    Frequency of Social Gatherings with Friends and Family: Once a week    Attends Religious Services: Never    Marine scientist or Organizations: Yes    Attends Music therapist: More than 4 times per year    Marital Status: Married  Human resources officer Violence: Not At Risk (09/30/2021)   Humiliation, Afraid, Rape, and Kick questionnaire    Fear of Current or Ex-Partner: No    Emotionally Abused: No    Physically Abused: No    Sexually Abused: No     Current Outpatient Medications:    Cholecalciferol (VITAMIN D3) 250 MCG (10000 UT) capsule, , Disp: , Rfl:    cyanocobalamin (,VITAMIN B-12,) 1000 MCG/ML injection, INJECT 1 ML INTO THE SKIN INTRAMUSCULARLY ONCE EVERY MONTH, Disp: 1 mL, Rfl: 11   DULoxetine (CYMBALTA) 60 MG capsule, Take 1 capsule (60 mg total) by mouth daily., Disp: 90 capsule, Rfl: 3   guanFACINE (INTUNIV) 2 MG TB24 ER tablet, Take 2 mg by mouth daily., Disp: , Rfl:    hydrOXYzine  (VISTARIL) 25 MG capsule, TAKE 1-2 CAPSULES (25-50 MG TOTAL) BY MOUTH 3 (THREE) TIMES DAILY AS NEEDED FOR ITCHING (HIVES)., Disp: 180 capsule, Rfl: 1   Magnesium 250 MG TABS, , Disp: , Rfl:    Omega-3 Fatty Acids (FISH OIL PO), 1 capsule 2 (two) times daily. 1532m, Disp: , Rfl:   Allergies  Allergen Reactions   Corn-Containing Products Other (See Comments)    She says she is only occasionally sensitive to corn   Milk Protein Hives    Patient reports lactose intolerance; she is able to eat G[place]yogurt without issue     Review of Systems  All other systems reviewed and are negative.    Objective  Vitals:   09/30/21 1541  BP: 122/80  Pulse: 92  Resp: 16  Temp: 98.3 F (36.8 C)  SpO2: 99%  Weight: 183 lb 3.2 oz (83.1 kg)  Height: '4\' 11"'  (1.499 m)    Body mass index is 37 kg/m.  Physical Exam Constitutional:      Appearance: Normal appearance.  HENT:     Head: Normocephalic and atraumatic.     Mouth/Throat:     Mouth: Mucous membranes are moist.     Pharynx: Oropharynx is clear.  Eyes:     Extraocular Movements: Extraocular movements intact.     Conjunctiva/sclera: Conjunctivae normal.     Pupils: Pupils are equal, round, and reactive to light.  Cardiovascular:     Rate and Rhythm: Normal rate and regular rhythm.  Pulmonary:     Effort: Pulmonary effort is normal.     Breath sounds: Normal breath sounds.  Musculoskeletal:     Right lower leg: No edema.     Left lower leg: No edema.  Skin:    General: Skin is warm and dry.  Neurological:     General: No focal deficit present.     Mental Status: She is alert. Mental status is at baseline.  Psychiatric:        Mood and Affect: Mood normal.        Behavior: Behavior normal.     No results found for this or any previous visit (from the past 2160 hour(s)).   Fall Risk:    09/30/2021    3:46 PM 07/09/2021    4:15 PM 06/25/2021    8:01 AM 06/05/2021    2:40 PM 04/24/2021    2:39 PM  [place] in the  past year? 0 0 0 0 0  Number falls in past yr: 0  0 0 0  Injury with Fall? 0  0 0 0  Risk for fall due to :   No Fall Risks    Follow up   Falls prevention discussed       Functional Status Survey: Is the patient deaf or have difficulty hearing?: No Does the patient have difficulty seeing, even when wearing glasses/contacts?: No Does the patient have difficulty concentrating, remembering, or making decisions?: No Does the patient have difficulty walking or climbing stairs?: No Does the patient have difficulty dressing or bathing?: No Does the patient have difficulty doing errands alone such as visiting a doctor's office or shopping?: No   Assessment & Plan  1. Annual visit for general adult medical examination without abnormal findings/Encounter for screening mammogram for malignant neoplasm of breast: Mammogram due.  All other screenings up-to-date.  We will follow-up in 1 year for recheck.  We will plan to call the patient in the fall to initiate starting dose of Ozempic/Wegovy when this becomes available.  - MM 3D SCREEN BREAST BILATERAL; Future   -USPSTF grade A and B recommendations reviewed with patient; age-appropriate recommendations, preventive care, screening tests, etc discussed and encouraged; healthy living encouraged; see AVS for patient education given to patient -Discussed importance of 150 minutes of physical activity weekly, eat two servings of fish weekly, eat one serving of tree nuts ( cashews, pistachios, pecans, almonds.Marland Kitchen) every other day, eat 6 servings of fruit/vegetables daily and drink plenty of water and avoid sweet beverages.   -Reviewed Health Maintenance: Yes.

## 2021-09-30 NOTE — Patient Instructions (Signed)

## 2021-10-16 DIAGNOSIS — H52229 Regular astigmatism, unspecified eye: Secondary | ICD-10-CM | POA: Diagnosis not present

## 2021-11-03 ENCOUNTER — Encounter: Payer: Self-pay | Admitting: Internal Medicine

## 2021-11-11 ENCOUNTER — Other Ambulatory Visit: Payer: Self-pay | Admitting: Family Medicine

## 2021-11-11 DIAGNOSIS — L509 Urticaria, unspecified: Secondary | ICD-10-CM

## 2021-11-11 DIAGNOSIS — R21 Rash and other nonspecific skin eruption: Secondary | ICD-10-CM

## 2021-11-11 NOTE — Telephone Encounter (Signed)
Ins wants 90 day  

## 2021-11-20 ENCOUNTER — Encounter: Payer: Self-pay | Admitting: Internal Medicine

## 2021-11-25 ENCOUNTER — Encounter: Payer: Self-pay | Admitting: Internal Medicine

## 2021-11-27 ENCOUNTER — Ambulatory Visit
Admission: RE | Admit: 2021-11-27 | Discharge: 2021-11-27 | Disposition: A | Discharge status: Home / Self Care | Payer: 59 | Source: Ambulatory Visit | Attending: Internal Medicine | Admitting: Internal Medicine

## 2021-11-27 DIAGNOSIS — Z1231 Encounter for screening mammogram for malignant neoplasm of breast: Secondary | ICD-10-CM | POA: Diagnosis not present

## 2021-12-14 ENCOUNTER — Other Ambulatory Visit: Payer: Self-pay | Admitting: Internal Medicine

## 2021-12-14 DIAGNOSIS — R21 Rash and other nonspecific skin eruption: Secondary | ICD-10-CM

## 2021-12-14 DIAGNOSIS — L509 Urticaria, unspecified: Secondary | ICD-10-CM

## 2021-12-15 NOTE — Telephone Encounter (Signed)
Requested medication (s) are due for refill today: yes  Requested medication (s) are on the active medication list: yes  Last refill:  11/11/21  Future visit scheduled: yes  Notes to clinic:  Unable to refill per protocol, Rx request a 90 day supply, DX code needed     Requested Prescriptions  Pending Prescriptions Disp Refills   hydrOXYzine (VISTARIL) 25 MG capsule [Pharmacy Med Name: HYDROXYZINE PAM 25 MG CAP] 270 capsule 1    Sig: TAKE 1-2 CAPSULES (25-50 MG TOTAL) BY MOUTH 3 (THREE) TIMES DAILY AS NEEDED FOR ITCHING (HIVES).     Ear, Nose, and Throat:  Antihistamines 2 Passed - 12/14/2021  9:32 AM      Passed - Cr in normal range and within 360 days    Creat  Date Value Ref Range Status  09/20/2019 0.67 0.50 - 1.10 mg/dL Final   Creatinine, Ser  Date Value Ref Range Status  01/30/2021 0.71 0.44 - 1.00 mg/dL Final         Passed - Valid encounter within last 12 months    Recent Outpatient Visits           2 months ago Annual visit for general adult medical examination without abnormal findings   [place], [place]   5 months ago Mild episode of recurrent major depressive disorder [facility])   [place], DO   6 months ago Weight gain   [place], DO   7 months ago Anxiety disorder, unspecified type   [facility], DO   10 months ago Weight gain   [facility] Delsa Grana, [place]

## 2022-01-07 DIAGNOSIS — Z79899 Other long term (current) drug therapy: Secondary | ICD-10-CM | POA: Diagnosis not present

## 2022-01-07 DIAGNOSIS — F902 Attention-deficit hyperactivity disorder, combined type: Secondary | ICD-10-CM | POA: Diagnosis not present

## 2022-01-07 DIAGNOSIS — R69 Illness, unspecified: Secondary | ICD-10-CM | POA: Diagnosis not present

## 2022-01-07 DIAGNOSIS — F419 Anxiety disorder, unspecified: Secondary | ICD-10-CM | POA: Diagnosis not present

## 2022-02-23 ENCOUNTER — Encounter: Payer: Self-pay | Admitting: Internal Medicine

## 2022-03-03 ENCOUNTER — Other Ambulatory Visit: Payer: Self-pay | Admitting: Internal Medicine

## 2022-03-03 DIAGNOSIS — E669 Obesity, unspecified: Secondary | ICD-10-CM

## 2022-03-03 DIAGNOSIS — E88819 Insulin resistance, unspecified: Secondary | ICD-10-CM

## 2022-03-03 MED ORDER — ZEPBOUND 2.5 MG/0.5ML ~~LOC~~ SOAJ: 2.5000 mg | SUBCUTANEOUS | 0 refills | Status: DC

## 2022-03-04 NOTE — Telephone Encounter (Signed)
Pharmacy comment: Alternative Requested:DRUG NOT COVERED.   Requested Prescriptions  Pending Prescriptions Disp Refills   Liraglutide -Weight Management ([place]) 18 MG/3ML SOPN [Pharmacy Med Name: SAXENDA 18 MG/3 ML PEN]  0     Endocrinology:  Diabetes - GLP-1 Receptor Agonists Failed - 03/03/2022 11:33 AM      Failed - HBA1C is between 0 and 7.9 and within 180 days    Hgb A1c MFr Bld  Date Value Ref Range Status  12/26/2020 5.3 <5.7 % of total Hgb Final    Comment:    For the purpose of screening for the presence of diabetes: . <5.7%       Consistent with the absence of diabetes 5.7-6.4%    Consistent with increased risk for diabetes             (prediabetes) > or =6.5%  Consistent with diabetes . This assay result is consistent with a decreased risk of diabetes. . Currently, no consensus exists regarding use of hemoglobin A1c for diagnosis of diabetes in children. . According to American Diabetes Association (ADA) guidelines, hemoglobin A1c <7.0% represents optimal control in non-pregnant diabetic patients. Different metrics may apply to specific patient populations.  Standards of Medical Care in Diabetes(ADA). Renella Cunas - Valid encounter within last 6 months    Recent Outpatient Visits           5 months ago Annual visit for general adult medical examination without abnormal findings   [facility] Teodora Medici, DO   8 months ago Mild episode of recurrent major depressive disorder [facility])   [facility] Teodora Medici, DO   9 months ago Weight gain   [place], DO   10 months ago Anxiety disorder, unspecified type   [facility] Teodora Medici, DO   1 year ago Weight gain   [facility] Delsa Grana, [place]

## 2022-05-28 ENCOUNTER — Other Ambulatory Visit: Payer: Self-pay | Admitting: Internal Medicine

## 2022-05-28 DIAGNOSIS — F419 Anxiety disorder, unspecified: Secondary | ICD-10-CM

## 2022-05-28 NOTE — Telephone Encounter (Signed)
Requested Prescriptions  Pending Prescriptions Disp Refills   DULoxetine (CYMBALTA) 60 MG capsule [Pharmacy Med Name: DULOXETINE HCL DR 60 MG CAP] 90 capsule 0    Sig: TAKE 1 CAPSULE BY MOUTH EVERY DAY     Psychiatry: Antidepressants - SNRI - duloxetine Failed - 05/28/2022  2:06 AM      Failed - Cr in normal range and within 360 days    Creat  Date Value Ref Range Status  09/20/2019 0.67 0.50 - 1.10 mg/dL Final   Creatinine, Ser  Date Value Ref Range Status  01/30/2021 0.71 0.44 - 1.00 mg/dL Final         Failed - eGFR is 30 or above and within 360 days    GFR, Est African American  Date Value Ref Range Status  09/20/2019 127 > OR = 60 mL/min/1.78m2 Final   GFR, Est Non African American  Date Value Ref Range Status  09/20/2019 110 > OR = 60 mL/min/1.1m2 Final   GFR, Estimated  Date Value Ref Range Status  01/30/2021 >60 >60 mL/min Final    Comment:    (NOTE) Calculated using the CKD-EPI Creatinine Equation (2021)          Failed - Valid encounter within last 6 months    Recent Outpatient Visits           8 months ago Annual visit for general adult medical examination without abnormal findings   [facility] Teodora Medici, DO   11 months ago Mild episode of recurrent major depressive disorder [facility])   [facility] Teodora Medici, DO   11 months ago Weight gain   [place], DO   1 year ago Anxiety disorder, unspecified type   [place], DO   1 year ago Weight gain   [facility] Delsa Grana, PA-C              Passed - Completed PHQ-2 or PHQ-9 in the last 360 days      Passed - Last BP in normal range    BP Readings from Last 1 Encounters:  09/30/21 122/80

## 2022-08-04 LAB — TSH: TSH: 1.28 (ref 0.41–5.90)

## 2022-08-04 LAB — HEMOGLOBIN A1C: Hemoglobin A1C: 5.9

## 2022-08-04 LAB — CBC AND DIFFERENTIAL: Hemoglobin: 12.7 (ref 12.0–16.0)

## 2022-08-28 ENCOUNTER — Other Ambulatory Visit: Payer: Self-pay | Admitting: Internal Medicine

## 2022-08-28 DIAGNOSIS — F419 Anxiety disorder, unspecified: Secondary | ICD-10-CM

## 2022-08-31 ENCOUNTER — Other Ambulatory Visit: Payer: Self-pay

## 2022-08-31 DIAGNOSIS — F419 Anxiety disorder, unspecified: Secondary | ICD-10-CM

## 2022-08-31 MED ORDER — DULOXETINE HCL 60 MG PO CPEP: 60.0000 mg | ORAL_CAPSULE | Freq: Every day | ORAL | 0 refills | Status: DC

## 2022-08-31 NOTE — Telephone Encounter (Signed)
Requested medication (s) are due for refill today: yes  Requested medication (s) are on the active medication list: yes  Last refill:  05/28/22 #90  Future visit scheduled: no  Notes to clinic:  called pt and LM on VM to call back and make appt- needs OV and lab work   Requested Prescriptions  Pending Prescriptions Disp Refills   DULoxetine (CYMBALTA) 60 MG capsule [Pharmacy Med Name: DULOXETINE HCL DR 60 MG CAP] 90 capsule 0    Sig: TAKE 1 CAPSULE BY MOUTH EVERY DAY     Psychiatry: Antidepressants - SNRI - duloxetine Failed - 08/28/2022  1:32 PM      Failed - Cr in normal range and within 360 days    Creat  Date Value Ref Range Status  09/20/2019 0.67 0.50 - 1.10 mg/dL Final   Creatinine, Ser  Date Value Ref Range Status  01/30/2021 0.71 0.44 - 1.00 mg/dL Final         Failed - eGFR is 30 or above and within 360 days    GFR, Est African American  Date Value Ref Range Status  09/20/2019 127 > OR = 60 mL/min/1.41m2 Final   GFR, Est Non African American  Date Value Ref Range Status  09/20/2019 110 > OR = 60 mL/min/1.9m2 Final   GFR, Estimated  Date Value Ref Range Status  01/30/2021 >60 >60 mL/min Final    Comment:    (NOTE) Calculated using the CKD-EPI Creatinine Equation (2021)          Failed - Valid encounter within last 6 months    Recent Outpatient Visits           11 months ago Annual visit for general adult medical examination without abnormal findings   [facility] Margarita Mail, DO   1 year ago Mild episode of recurrent major depressive disorder [facility])   [facility] Margarita Mail, DO   1 year ago Weight gain   [facility] Margarita Mail, DO   1 year ago Anxiety disorder, unspecified type   [facility] Margarita Mail, DO   1 year ago Weight gain   [facility] Danelle Berry, PA-C               Passed - Completed PHQ-2 or PHQ-9 in the last 360 days      Passed - Last BP in normal range    BP Readings from Last 1 Encounters:  09/30/21 122/80

## 2022-09-28 ENCOUNTER — Encounter: Payer: Self-pay | Admitting: Internal Medicine

## 2022-10-04 NOTE — Progress Notes (Unsigned)
Established Patient Office Visit  Subjective:  Patient ID: Sandra Munoz, female    DOB: June 27, 1980  Age: 42 y.o. MRN: [id number]  CC:  No chief complaint on file.   HPI Sandra Munoz presents for follow up on chronic medical conditions.   MDD: -Mood status: uncontrolled -Current treatment: Cymbalta 60 (increased at LOV), Hydroxyzine 25-50 PRN, using about twice a week (takes at night, trying to wean down)  -Satisfied with current treatment?: yes - doing much better since increasing the medication, having some stress at work recently but has been handling everything well  -Symptom severity: moderate  -Duration of current treatment : 2 months  -Side effects: no; slight sluggishness in the mornings but this is tolerable  Medication compliance: excellent compliance Psychotherapy/counseling: no in the past Previous psychiatric medications: lexapro and zoloft Depressed mood: yes Anxious mood: yes Anhedonia: no Significant weight loss or gain: no Insomnia: no  Fatigue: yes     09/30/2021    3:46 PM 07/09/2021    4:15 PM 06/25/2021    8:01 AM 06/05/2021    2:42 PM 04/24/2021    2:39 PM  Depression screen PHQ 2/9  Decreased Interest 0 0 0 0 1  Down, Depressed, Hopeless 0 1 0 0 1  PHQ - 2 Score 0 1 0 0 2  Altered sleeping 0  0 0 2  Tired, decreased energy 0  0 0 2  Change in appetite 0  0 0 0  Feeling bad or failure about yourself  0  0 0 0  Trouble concentrating 0  0 0 0  Moving slowly or fidgety/restless 0  0 0 0  Suicidal thoughts 0  0 0 0  PHQ-9 Score 0  0 0 6  Difficult doing work/chores Not difficult at all   Not difficult at all    ADHD: -Following with specialist -Currently on Guanfacine 2 mg, no issues with this  HLD: -Medications: None -Last lipid panel: Lipid Panel     Component Value Date/Time   CHOL 195 09/24/2020 0845   TRIG 106 09/24/2020 0845   HDL 52 09/24/2020 0845   CHOLHDL 3.8 09/24/2020 0845   LDLCALC 122 (H) 09/24/2020 0845   The ASCVD Risk  score (Arnett DK, et al., 2019) failed to calculate for the following reasons:   The systolic blood pressure is missing  Pre-Diabetes: -Last A1c here 5.3 in 10/22, had a physical at work and her A1c  was 5.9%. She is now working out at a gym doing cardio and [employer] and working with a trainer there. She has made dietary changes and is eating lower carbs - only eating long grain brown rice and focusing more on proteins.  -At our last visit we discussed a nutrition referral, she is scheduled to see them in 3 weeks  Health Maintenance: -Blood work UTD -Mammogram 3/22 - CPE scheduled in July, will plan mammogram around that time   Past Medical History:  Diagnosis Date   Allergy    Depression    Polycystic ovarian syndrome     Past Surgical History:  Procedure Laterality Date   ABDOMINAL HYSTERECTOMY     GASTRIC BYPASS     REDUCTION MAMMAPLASTY Bilateral 2002    Family History  Problem Relation Age of Onset   Cancer Mother        SCC of skin/Involved throat   Autoimmune disease Mother    Breast cancer Neg Hx     Social History   Socioeconomic History   Marital  status: Married    Spouse name: Cristal Deer   Number of children: 1   Years of education: Not on file   Highest education level: Not on file  Occupational History   Not on file  Tobacco Use   Smoking status: Former    Current packs/day: 0.00    Types: Cigarettes    Quit date: 09/30/2009    Years since quitting: 13.0   Smokeless tobacco: Never  Vaping Use   Vaping status: Never Used  Substance and Sexual Activity   Alcohol use: Not Currently   Drug use: Never   Sexual activity: Yes    Birth control/protection: Surgical  Other Topics Concern   Not on file  Social History Narrative   Moved from [place]; Accountant; 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    Social Determinants of Health   Financial Resource Strain: Low Risk  (09/30/2021)   Overall Financial Resource Strain (CARDIA)     Difficulty of Paying Living Expenses: Not hard at all  Food Insecurity: No Food Insecurity (09/30/2021)   Hunger Vital Sign    Worried About Running Out of Food in the Last Year: Never true    Ran Out of Food in the Last Year: Never true  Transportation Needs: No Transportation Needs (09/30/2021)   PRAPARE - Administrator, Civil Service (Medical): No    Lack of Transportation (Non-Medical): No  Physical Activity: Insufficiently Active (09/30/2021)   Exercise Vital Sign    Days of Exercise per Week: 3 days    Minutes of Exercise per Session: 30 min  Stress: Stress Concern Present (09/30/2021)   [employer] of Occupational Health - Occupational Stress Questionnaire    Feeling of Stress : To some extent  Social Connections: Moderately Integrated (09/30/2021)   Social Connection and Isolation Panel [NHANES]    Frequency of Communication with Friends and Family: More than three times a week    Frequency of Social Gatherings with Friends and Family: Once a week    Attends Religious Services: Never    Database administrator or Organizations: Yes    Attends Engineer, structural: More than 4 times per year    Marital Status: Married  Catering manager Violence: Not At Risk (09/30/2021)   Humiliation, Afraid, Rape, and Kick questionnaire    Fear of Current or Ex-Partner: No    Emotionally Abused: No    Physically Abused: No    Sexually Abused: No    Outpatient Medications Prior to Visit  Medication Sig Dispense Refill   Cholecalciferol (VITAMIN D3) 250 MCG (10000 UT) capsule      cyanocobalamin (,VITAMIN B-12,) 1000 MCG/ML injection INJECT 1 ML INTO THE SKIN INTRAMUSCULARLY ONCE EVERY MONTH 1 mL 11   DULoxetine (CYMBALTA) 60 MG capsule Take 1 capsule (60 mg total) by mouth daily. 90 capsule 0   guanFACINE (INTUNIV) 2 MG TB24 ER tablet Take 2 mg by mouth daily.     hydrOXYzine (VISTARIL) 25 MG capsule TAKE 1-2 CAPSULES (25-50 MG TOTAL) BY MOUTH 3 (THREE) TIMES DAILY AS NEEDED  FOR ITCHING (HIVES). 270 capsule 1   Magnesium 250 MG TABS      Omega-3 Fatty Acids (FISH OIL PO) 1 capsule 2 (two) times daily. 1500mg      tirzepatide (ZEPBOUND) 2.5 MG/0.5ML Pen Inject 2.5 mg into the skin once a week. 2 mL 0   No facility-administered medications prior to visit.    Allergies  Allergen Reactions   Corn-Containing Products  Other (See Comments)    She says she is only occasionally sensitive to corn   Milk Protein Hives    Patient reports lactose intolerance; she is able to eat [place] yogurt without issue    ROS Review of Systems  Constitutional:  Negative for chills, fever and unexpected weight change.  Psychiatric/Behavioral:  Negative for sleep disturbance. The patient is not nervous/anxious and is not hyperactive.       Objective:    Physical Exam Constitutional:      Appearance: Normal appearance.  HENT:     Head: Normocephalic and atraumatic.  Eyes:     Conjunctiva/sclera: Conjunctivae normal.  Cardiovascular:     Rate and Rhythm: Normal rate and regular rhythm.  Pulmonary:     Effort: Pulmonary effort is normal.     Breath sounds: Normal breath sounds.  Skin:    General: Skin is warm and dry.  Neurological:     General: No focal deficit present.     Mental Status: She is alert. Mental status is at baseline.  Psychiatric:        Mood and Affect: Mood normal.        Behavior: Behavior normal.     There were no vitals taken for this visit. Wt Readings from Last 3 Encounters:  09/30/21 183 lb 3.2 oz (83.1 kg)  07/09/21 183 lb (83 kg)  06/25/21 179 lb (81.2 kg)     Health Maintenance Due  Topic Date Due   COVID-19 Vaccine (6 - 2023-24 season) 10/31/2021   INFLUENZA VACCINE  10/01/2022    There are no preventive care reminders to display for this patient.  Lab Results  Component Value Date   TSH 0.411 (L) 05/21/2020   Lab Results  Component Value Date   WBC 7.2 01/30/2021   HGB 12.3 01/30/2021   HCT 36.2 01/30/2021   MCV 86.2  01/30/2021   PLT 267 01/30/2021   Lab Results  Component Value Date   NA 136 01/30/2021   K 3.6 01/30/2021   CO2 23 01/30/2021   GLUCOSE 132 (H) 01/30/2021   BUN 13 01/30/2021   CREATININE 0.71 01/30/2021   BILITOT 0.5 08/02/2020   ALKPHOS 35 (L) 08/02/2020   AST 20 08/02/2020   ALT 17 08/02/2020   PROT 6.5 08/02/2020   ALBUMIN 3.7 08/02/2020   CALCIUM 8.8 (L) 01/30/2021   ANIONGAP 5 01/30/2021   Lab Results  Component Value Date   CHOL 195 09/24/2020   Lab Results  Component Value Date   HDL 52 09/24/2020   Lab Results  Component Value Date   LDLCALC 122 (H) 09/24/2020   Lab Results  Component Value Date   TRIG 106 09/24/2020   Lab Results  Component Value Date   CHOLHDL 3.8 09/24/2020   Lab Results  Component Value Date   HGBA1C 5.3 12/26/2020      Assessment & Plan:   Problem List Items Addressed This Visit   None     Follow-up: No follow-ups on file.    Margarita Mail, DO

## 2022-10-05 ENCOUNTER — Encounter: Payer: Self-pay | Admitting: Internal Medicine

## 2022-10-05 ENCOUNTER — Ambulatory Visit: Payer: 59 | Admitting: Internal Medicine

## 2022-10-05 VITALS — BP 118/82 | HR 109 | Temp 97.9°F | Resp 18 | Ht 59.0 in | Wt 194.3 lb

## 2022-10-05 DIAGNOSIS — F419 Anxiety disorder, unspecified: Secondary | ICD-10-CM | POA: Diagnosis not present

## 2022-10-05 DIAGNOSIS — E88819 Insulin resistance, unspecified: Secondary | ICD-10-CM | POA: Diagnosis not present

## 2022-10-05 MED ORDER — HYDROXYZINE HCL 10 MG PO TABS: 10.0000 mg | ORAL_TABLET | Freq: Three times a day (TID) | ORAL | 0 refills | Status: DC | PRN

## 2022-10-05 MED ORDER — CLONAZEPAM 0.5 MG PO TABS: 0.5000 mg | ORAL_TABLET | Freq: Every day | ORAL | 0 refills | Status: AC

## 2022-10-05 MED ORDER — METFORMIN HCL ER 500 MG PO TB24: 500.0000 mg | ORAL_TABLET | Freq: Every day | ORAL | 2 refills | Status: DC

## 2022-10-05 MED ORDER — DULOXETINE HCL 60 MG PO CPEP
60.0000 mg | ORAL_CAPSULE | Freq: Every day | ORAL | 1 refills | Status: DC
Start: 2022-10-05 — End: 2023-07-05

## 2022-10-05 NOTE — Patient Instructions (Addendum)
It was great seeing you today!  Plan discussed at today's visit: -Switch Metformin to 500 mg once a day extended release version -Can take Klonopin 0.5 mg at bedtime for 12 weeks, continue Cymbalta. Then transition to Hydroxyzine   Follow up in: 3 months   Take care and let us know if you have any questions or concerns prior to your next visit.  Dr. Caralee Ates

## 2022-12-18 ENCOUNTER — Encounter: Payer: Self-pay | Admitting: Internal Medicine

## 2022-12-18 ENCOUNTER — Ambulatory Visit: Payer: 59 | Admitting: Internal Medicine

## 2022-12-18 VITALS — BP 124/76 | HR 102 | Temp 98.5°F | Resp 18 | Ht 59.0 in | Wt 184.7 lb

## 2022-12-18 DIAGNOSIS — R7303 Prediabetes: Secondary | ICD-10-CM

## 2022-12-18 DIAGNOSIS — M7989 Other specified soft tissue disorders: Secondary | ICD-10-CM | POA: Diagnosis not present

## 2022-12-18 DIAGNOSIS — D1722 Benign lipomatous neoplasm of skin and subcutaneous tissue of left arm: Secondary | ICD-10-CM | POA: Diagnosis not present

## 2022-12-18 NOTE — Progress Notes (Signed)
   Acute Office Visit  Subjective:     Patient ID: Sandra Munoz, female    DOB: [birth year], 42 y.o.   MRN: [id number]  Chief Complaint  Patient presents with   Mass    Left arm    HPI Patient is in today for lump on arm. States she first noticed it above her elbow 2 weeks ago. At first it was soft and squishy, now more hard and enlarging, traveling down the side of her elbow. No recent illness or immunizations. No scratches or wounds. No erythema, warmth. No fevers. Does have hot flashes at night and lost 10 pounds since starting Metformin. Now having numbness and compression symptoms in left hand.   Review of Systems  Constitutional:  Positive for malaise/fatigue and weight loss. Negative for chills and fever.  HENT:  Negative for congestion, sinus pain and sore throat.   Eyes:  Negative for blurred vision.  Respiratory:  Negative for shortness of breath.   Cardiovascular:  Negative for chest pain.  Musculoskeletal:  Negative for joint pain.        Objective:    BP 124/76   Pulse (!) 102   Temp 98.5 F (36.9 C)   Resp 18   Ht 4\' 11"  (1.499 m)   Wt 184 lb 11.2 oz (83.8 kg)   SpO2 98%   BMI 37.30 kg/m    Physical Exam Constitutional:      Appearance: Normal appearance.  HENT:     Head: Normocephalic and atraumatic.  Eyes:     Conjunctiva/sclera: Conjunctivae normal.  Cardiovascular:     Rate and Rhythm: Normal rate and regular rhythm.  Pulmonary:     Effort: Pulmonary effort is normal.     Breath sounds: Normal breath sounds.  Skin:    General: Skin is warm and dry.     Comments: Large surface area, about 1.5 x 1.5 inch soft tissue irregularity, firm, solid and non-mobile.   Neurological:     General: No focal deficit present.     Mental Status: She is alert. Mental status is at baseline.  Psychiatric:        Mood and Affect: Mood normal.        Behavior: Behavior normal.     No results found for any visits on 12/18/22.      Assessment & Plan:    1. Mass of soft tissue of upper arm: Firm and solid, irregularly shaped. Will obtain US for further evaluation. Check CBC, CMP.   - US SOFT TISSUE UPPER EXTREMITY LIMITED LEFT (NON-VASCULAR); Future - CBC w/Diff/Platelet - COMPLETE METABOLIC PANEL WITH GFR  2. Prediabetes: Recheck A1c.   - HgB A1c   Return for already scheduled.  Margarita Mail, DO

## 2022-12-19 LAB — CBC WITH DIFFERENTIAL/PLATELET
Absolute Lymphocytes: 2856 {cells}/uL (ref 850–3900)
Absolute Monocytes: 526 {cells}/uL (ref 200–950)
Basophils Absolute: 45 {cells}/uL (ref 0–200)
Basophils Relative: 0.4 %
Eosinophils Absolute: 146 {cells}/uL (ref 15–500)
Eosinophils Relative: 1.3 %
HCT: 39.6 % (ref 35.0–45.0)
Hemoglobin: 12.8 g/dL (ref 11.7–15.5)
MCH: 27.6 pg (ref 27.0–33.0)
MCHC: 32.3 g/dL (ref 32.0–36.0)
MCV: 85.3 fL (ref 80.0–100.0)
MPV: 10.8 fL (ref 7.5–12.5)
Monocytes Relative: 4.7 %
Neutro Abs: 7627 {cells}/uL (ref 1500–7800)
Neutrophils Relative %: 68.1 %
Platelets: 384 10*3/uL (ref 140–400)
RBC: 4.64 10*6/uL (ref 3.80–5.10)
RDW: 13.4 % (ref 11.0–15.0)
Total Lymphocyte: 25.5 %
WBC: 11.2 10*3/uL — ABNORMAL HIGH (ref 3.8–10.8)

## 2022-12-19 LAB — COMPLETE METABOLIC PANEL WITH GFR
AG Ratio: 1.6 (calc) (ref 1.0–2.5)
ALT: 14 U/L (ref 6–29)
AST: 13 U/L (ref 10–30)
Albumin: 4.2 g/dL (ref 3.6–5.1)
Alkaline phosphatase (APISO): 46 U/L (ref 31–125)
BUN: 10 mg/dL (ref 7–25)
CO2: 25 mmol/L (ref 20–32)
Calcium: 9 mg/dL (ref 8.6–10.2)
Chloride: 104 mmol/L (ref 98–110)
Creat: 0.78 mg/dL (ref 0.50–0.99)
Globulin: 2.7 g/dL (ref 1.9–3.7)
Glucose, Bld: 95 mg/dL (ref 65–99)
Potassium: 4.3 mmol/L (ref 3.5–5.3)
Sodium: 138 mmol/L (ref 135–146)
Total Bilirubin: 0.4 mg/dL (ref 0.2–1.2)
Total Protein: 6.9 g/dL (ref 6.1–8.1)
eGFR: 97 mL/min/{1.73_m2} (ref >=60)

## 2022-12-19 LAB — HEMOGLOBIN A1C
Hgb A1c MFr Bld: 6 %{Hb} — ABNORMAL HIGH (ref <5.7)
Mean Plasma Glucose: 126 mg/dL
eAG (mmol/L): 7 mmol/L

## 2022-12-21 ENCOUNTER — Encounter: Payer: Self-pay | Admitting: Internal Medicine

## 2022-12-22 ENCOUNTER — Ambulatory Visit
Admission: RE | Admit: 2022-12-22 | Discharge: 2022-12-22 | Disposition: A | Discharge status: Home / Self Care | Payer: 59 | Source: Ambulatory Visit | Attending: Internal Medicine | Admitting: Internal Medicine

## 2022-12-22 DIAGNOSIS — M7989 Other specified soft tissue disorders: Secondary | ICD-10-CM | POA: Diagnosis present

## 2022-12-31 ENCOUNTER — Other Ambulatory Visit: Payer: Self-pay | Admitting: Internal Medicine

## 2022-12-31 DIAGNOSIS — E88819 Insulin resistance, unspecified: Secondary | ICD-10-CM

## 2022-12-31 NOTE — Telephone Encounter (Signed)
Requested Prescriptions  Pending Prescriptions Disp Refills   metFORMIN (GLUCOPHAGE-XR) 500 MG 24 hr tablet [Pharmacy Med Name: METFORMIN HCL ER 500 MG TABLET] 90 tablet 0    Sig: TAKE 1 TABLET BY MOUTH EVERY DAY WITH BREAKFAST     Endocrinology:  Diabetes - Biguanides Failed - 12/31/2022  2:37 AM      Failed - B12 Level in normal range and within 720 days    Vitamin B-12  Date Value Ref Range Status  09/24/2020 198 (L) 200 - 1,100 pg/mL Final         Failed - CBC within normal limits and completed in the last 12 months    WBC  Date Value Ref Range Status  12/18/2022 11.2 (H) 3.8 - 10.8 Thousand/uL Final   RBC  Date Value Ref Range Status  12/18/2022 4.64 3.80 - 5.10 Million/uL Final   Hemoglobin  Date Value Ref Range Status  12/18/2022 12.8 11.7 - 15.5 g/dL Final  16/12/9602 54.0 11.1 - 15.9 g/dL Final   HCT  Date Value Ref Range Status  12/18/2022 39.6 35.0 - 45.0 % Final   Hematocrit  Date Value Ref Range Status  05/21/2020 39.9 34.0 - 46.6 % Final   MCHC  Date Value Ref Range Status  12/18/2022 32.3 32.0 - 36.0 g/dL Final    Comment:    For adults, a slight decrease in the calculated MCHC value (in the range of 30 to 32 g/dL) is most likely not clinically significant; however, it should be interpreted with caution in correlation with other red cell parameters and the patient's clinical condition.    [facility]  Date Value Ref Range Status  12/18/2022 27.6 27.0 - 33.0 pg Final   MCV  Date Value Ref Range Status  12/18/2022 85.3 80.0 - 100.0 fL Final  05/21/2020 90 79 - 97 fL Final   No results found for: "PLTCOUNTKUC", "LABPLAT", "POCPLA" RDW  Date Value Ref Range Status  12/18/2022 13.4 11.0 - 15.0 % Final  05/21/2020 12.1 11.7 - 15.4 % Final         Passed - Cr in normal range and within 360 days    Creat  Date Value Ref Range Status  12/18/2022 0.78 0.50 - 0.99 mg/dL Final         Passed - HBA1C is between 0 and 7.9 and within 180 days     Hemoglobin A1C  Date Value Ref Range Status  08/04/2022 5.9  Final   Hgb A1c MFr Bld  Date Value Ref Range Status  12/18/2022 6.0 (H) <5.7 % of total Hgb Final    Comment:    For someone without known diabetes, a hemoglobin  A1c value between 5.7% and 6.4% is consistent with prediabetes and should be confirmed with a  follow-up test. . For someone with known diabetes, a value <7% indicates that their diabetes is well controlled. A1c targets should be individualized based on duration of diabetes, age, comorbid conditions, and other considerations. . This assay result is consistent with an increased risk of diabetes. . Currently, no consensus exists regarding use of hemoglobin A1c for diagnosis of diabetes for children. .          Passed - eGFR in normal range and within 360 days    GFR, Est African American  Date Value Ref Range Status  09/20/2019 127 > OR = 60 mL/min/1.20m2 Final   GFR, Est Non African American  Date Value Ref Range Status  09/20/2019 110 > OR =  60 mL/min/1.74m2 Final   GFR, Estimated  Date Value Ref Range Status  01/30/2021 >60 >60 mL/min Final    Comment:    (NOTE) Calculated using the CKD-EPI Creatinine Equation (2021)    eGFR  Date Value Ref Range Status  12/18/2022 97 > OR = 60 mL/min/1.49m2 Final         Passed - Valid encounter within last 6 months    Recent Outpatient Visits           1 week ago Mass of soft tissue of upper arm   [facility] Margarita Mail, DO   2 months ago Anxiety disorder, unspecified type   [facility] Margarita Mail, DO   1 year ago Annual visit for general adult medical examination without abnormal findings   [facility] Margarita Mail, DO   1 year ago Mild episode of recurrent major depressive disorder [facility])   [facility] Margarita Mail, DO   1 year ago Weight gain   [facility] Margarita Mail, DO       Future Appointments             In 5 days Margarita Mail, DO [facility]

## 2023-01-01 NOTE — Telephone Encounter (Signed)
Copied from CRM [id number]. Topic: Appointment Scheduling - Scheduling Inquiry for Clinic >> Jan 01, 2023 11:03 AM Marlow Baars wrote: Reason for CRM: The patient called in stating she is very concerned about the lump as well as new lump on her inner elbow. She has a follow up on Tuesday with her provider to go over her U/S from 10/22 but she still has not received results from that in her my chart. Please assist her further as she is quite worried and anxious.

## 2023-01-04 NOTE — Progress Notes (Unsigned)
Established Patient Office Visit  Subjective:  Patient ID: Sandra Munoz, female    DOB: 09-16-1980  Age: 42 y.o. MRN: [id number]  CC:  No chief complaint on file.   HPI Sandra Munoz presents for follow up on chronic medical conditions.   MDD: -Mood status: uncontrolled -patient recently found out something very stressful going on in her personal life and anxiety has been much worse as well as insomnia -Current treatment: Cymbalta 60 mg, hydroxyzine 10 mg PRN, Klonopin 0.5 mg PRN -Satisfied with current treatment?: no  -Symptom severity: moderate  -Duration of current treatment : Months -Side effects: no; slight sluggishness in the mornings but this is tolerable  Medication compliance: excellent compliance Psychotherapy/counseling: no in the past Previous psychiatric medications: lexapro and zoloft Depressed mood: yes Anxious mood: yes Anhedonia: no Significant weight loss or gain: no Insomnia: yes  Fatigue: yes     12/18/2022    1:50 PM 09/30/2021    3:46 PM 07/09/2021    4:15 PM 06/25/2021    8:01 AM 06/05/2021    2:42 PM  Depression screen PHQ 2/9  Decreased Interest 0 0 0 0 0  Down, Depressed, Hopeless 0 0 1 0 0  PHQ - 2 Score 0 0 1 0 0  Altered sleeping 0 0  0 0  Tired, decreased energy 0 0  0 0  Change in appetite 0 0  0 0  Feeling bad or failure about yourself  0 0  0 0  Trouble concentrating 0 0  0 0  Moving slowly or fidgety/restless 0 0  0 0  Suicidal thoughts 0 0  0 0  PHQ-9 Score 0 0  0 0  Difficult doing work/chores Not difficult at all Not difficult at all   Not difficult at all   ADHD: -Following with specialist -Currently on Guanfacine 2 mg, no issues with this  HLD: -Medications: None -Last lipid panel: Lipid Panel     Component Value Date/Time   CHOL 195 09/24/2020 0845   TRIG 106 09/24/2020 0845   HDL 52 09/24/2020 0845   CHOLHDL 3.8 09/24/2020 0845   LDLCALC 122 (H) 09/24/2020 0845   The 10-year ASCVD risk score (Arnett DK, et al.,  2019) is: 0.7%   Values used to calculate the score:     Age: 16 years     Sex: Female     Is Non-Hispanic African American: No     Diabetic: No     Tobacco smoker: No     Systolic Blood Pressure: 124 mmHg     Is BP treated: No     HDL Cholesterol: 52 mg/dL     Total Cholesterol: 195 mg/dL  Pre-Diabetes: -W0J 81/19 6.0% -She is now working out at a gym doing cardio and weights and working with a Psychologist, educational there. She has made dietary changes and is eating lower carbs - only eating long grain brown rice and focusing more on proteins.  -We have tried prescribing GLP-1's in the past, however her insurance will not cover -Her gynecologist recently put her on metformin 500 mg once a day, however due to history of gastric bypass the patient is having severe nausea and abdominal cramping and is taking very sporadically  Health Maintenance: -Blood work UTD -obtaining records -Mammogram 9/23  Past Medical History:  Diagnosis Date   Allergy    Depression    Polycystic ovarian syndrome     Past Surgical History:  Procedure Laterality Date   ABDOMINAL HYSTERECTOMY  GASTRIC BYPASS     REDUCTION MAMMAPLASTY Bilateral 2002    Family History  Problem Relation Age of Onset   Cancer Mother        SCC of skin/Involved throat   Autoimmune disease Mother    Breast cancer Neg Hx     Social History   Socioeconomic History   Marital status: Married    Spouse name: Cristal Deer   Number of children: 1   Years of education: Not on file   Highest education level: Bachelor's degree (e.g., BA, AB, BS)  Occupational History   Not on file  Tobacco Use   Smoking status: Former    Current packs/day: 0.00    Types: Cigarettes    Quit date: 09/30/2009    Years since quitting: 13.2   Smokeless tobacco: Never  Vaping Use   Vaping status: Never Used  Substance and Sexual Activity   Alcohol use: Not Currently   Drug use: Never   Sexual activity: Yes    Birth control/protection: Surgical   Other Topics Concern   Not on file  Social History Narrative   Moved from [place]; Accountant; 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    Social Determinants of Health   Financial Resource Strain: Low Risk  (12/17/2022)   Overall Financial Resource Strain (CARDIA)    Difficulty of Paying Living Expenses: Not hard at all  Food Insecurity: No Food Insecurity (12/17/2022)   Hunger Vital Sign    Worried About Running Out of Food in the Last Year: Never true    Ran Out of Food in the Last Year: Never true  Transportation Needs: No Transportation Needs (12/17/2022)   PRAPARE - Administrator, Civil Service (Medical): No    Lack of Transportation (Non-Medical): No  Physical Activity: Unknown (12/17/2022)   Exercise Vital Sign    Days of Exercise per Week: 0 days    Minutes of Exercise per Session: Not on file  Stress: Stress Concern Present (12/17/2022)   [employer] of Occupational Health - Occupational Stress Questionnaire    Feeling of Stress : To some extent  Social Connections: Moderately Isolated (12/17/2022)   Social Connection and Isolation Panel [NHANES]    Frequency of Communication with Friends and Family: More than three times a week    Frequency of Social Gatherings with Friends and Family: Once a week    Attends Religious Services: Never    Database administrator or Organizations: No    Attends Engineer, structural: Not on file    Marital Status: Married  Catering manager Violence: Not At Risk (09/30/2021)   Humiliation, Afraid, Rape, and Kick questionnaire    Fear of Current or Ex-Partner: No    Emotionally Abused: No    Physically Abused: No    Sexually Abused: No    Outpatient Medications Prior to Visit  Medication Sig Dispense Refill   Cholecalciferol (VITAMIN D3) 250 MCG (10000 UT) capsule      clonazePAM (KLONOPIN) 0.5 MG tablet Take 1 tablet (0.5 mg total) by mouth at bedtime. 14 tablet 0   cyanocobalamin (,VITAMIN  B-12,) 1000 MCG/ML injection INJECT 1 ML INTO THE SKIN INTRAMUSCULARLY ONCE EVERY MONTH 1 mL 11   DULoxetine (CYMBALTA) 60 MG capsule Take 1 capsule (60 mg total) by mouth daily. 90 capsule 1   guanFACINE (INTUNIV) 2 MG TB24 ER tablet Take 2 mg by mouth daily.     hydrOXYzine (ATARAX) 10 MG tablet Take 1 tablet (  10 mg total) by mouth 3 (three) times daily as needed. 30 tablet 0   Magnesium 250 MG TABS      metFORMIN (GLUCOPHAGE-XR) 500 MG 24 hr tablet TAKE 1 TABLET BY MOUTH EVERY DAY WITH BREAKFAST 90 tablet 0   Omega-3 Fatty Acids (FISH OIL PO) 1 capsule 2 (two) times daily. 1500mg      No facility-administered medications prior to visit.    Allergies  Allergen Reactions   Corn-Containing Products Other (See Comments)    She says she is only occasionally sensitive to corn   Milk Protein Hives    Patient reports lactose intolerance; she is able to eat [place] yogurt without issue    ROS Review of Systems  Constitutional:  Negative for chills, fever and unexpected weight change.  Respiratory:  Negative for shortness of breath.   Gastrointestinal:  Positive for abdominal pain and nausea.  Psychiatric/Behavioral:  Positive for sleep disturbance. The patient is nervous/anxious.       Objective:    Physical Exam Constitutional:      Appearance: Normal appearance.  HENT:     Head: Normocephalic and atraumatic.  Eyes:     Conjunctiva/sclera: Conjunctivae normal.  Cardiovascular:     Rate and Rhythm: Normal rate and regular rhythm.  Pulmonary:     Effort: Pulmonary effort is normal.     Breath sounds: Normal breath sounds.  Skin:    General: Skin is warm and dry.  Neurological:     General: No focal deficit present.     Mental Status: She is alert. Mental status is at baseline.  Psychiatric:        Mood and Affect: Mood is depressed.        Behavior: Behavior normal.     There were no vitals taken for this visit. Wt Readings from Last 3 Encounters:  12/18/22 184 lb 11.2 oz  (83.8 kg)  10/05/22 194 lb 4.8 oz (88.1 kg)  09/30/21 183 lb 3.2 oz (83.1 kg)     Health Maintenance Due  Topic Date Due   INFLUENZA VACCINE  10/01/2022   COVID-19 Vaccine (6 - 2023-24 season) 11/01/2022   MAMMOGRAM  11/28/2022    There are no preventive care reminders to display for this patient.  Lab Results  Component Value Date   TSH 1.28 08/04/2022   Lab Results  Component Value Date   WBC 11.2 (H) 12/18/2022   HGB 12.8 12/18/2022   HCT 39.6 12/18/2022   MCV 85.3 12/18/2022   PLT 384 12/18/2022   Lab Results  Component Value Date   NA 138 12/18/2022   K 4.3 12/18/2022   CO2 25 12/18/2022   GLUCOSE 95 12/18/2022   BUN 10 12/18/2022   CREATININE 0.78 12/18/2022   BILITOT 0.4 12/18/2022   ALKPHOS 35 (L) 08/02/2020   AST 13 12/18/2022   ALT 14 12/18/2022   PROT 6.9 12/18/2022   ALBUMIN 3.7 08/02/2020   CALCIUM 9.0 12/18/2022   ANIONGAP 5 01/30/2021   EGFR 97 12/18/2022   Lab Results  Component Value Date   CHOL 195 09/24/2020   Lab Results  Component Value Date   HDL 52 09/24/2020   Lab Results  Component Value Date   LDLCALC 122 (H) 09/24/2020   Lab Results  Component Value Date   TRIG 106 09/24/2020   Lab Results  Component Value Date   CHOLHDL 3.8 09/24/2020   Lab Results  Component Value Date   HGBA1C 6.0 (H) 12/18/2022  Assessment & Plan:   1. Anxiety disorder, unspecified type: Patient going through something very stressful currently and having a hard time sleeping with increased anxiety, will prescribe Klonopin 0.5 mg to take at bedtime as needed.  Will only prescribe 14 tablets and will not refill, discussed this with patient and she understands due to potential side effects and habit-forming medication.  Will continue Cymbalta 60 mg, refilled.  Plan to transition to hydroxyzine 10 mg as needed.  Follow-up in 3 months.  - hydrOXYzine (ATARAX) 10 MG tablet; Take 1 tablet (10 mg total) by mouth 3 (three) times daily as needed.   Dispense: 30 tablet; Refill: 0 - clonazePAM (KLONOPIN) 0.5 MG tablet; Take 1 tablet (0.5 mg total) by mouth at bedtime.  Dispense: 14 tablet; Refill: 0 - DULoxetine (CYMBALTA) 60 MG capsule; Take 1 capsule (60 mg total) by mouth daily.  Dispense: 90 capsule; Refill: 1  2. Insulin resistance: Cannot tolerate metformin, will switch to 500 mg once daily extended release version.  Follow-up in 3 months to recheck A1c.  - metFORMIN (GLUCOPHAGE-XR) 500 MG 24 hr tablet; Take 1 tablet (500 mg total) by mouth daily with breakfast.  Dispense: 30 tablet; Refill: 2   Follow-up: No follow-ups on file.    Margarita Mail, DO

## 2023-01-05 ENCOUNTER — Ambulatory Visit (INDEPENDENT_AMBULATORY_CARE_PROVIDER_SITE_OTHER): Payer: 59 | Admitting: Internal Medicine

## 2023-01-05 ENCOUNTER — Encounter: Payer: Self-pay | Admitting: Internal Medicine

## 2023-01-05 VITALS — BP 116/74 | HR 98 | Temp 98.1°F | Resp 16 | Ht 59.0 in | Wt 184.6 lb

## 2023-01-05 DIAGNOSIS — M7989 Other specified soft tissue disorders: Secondary | ICD-10-CM

## 2023-01-06 ENCOUNTER — Telehealth: Payer: Self-pay | Admitting: Internal Medicine

## 2023-01-06 NOTE — Telephone Encounter (Signed)
Pt has been notified earlier

## 2023-01-06 NOTE — Addendum Note (Signed)
Addended by: Margarita Mail on: 01/06/2023 10:52 AM   Modules accepted: Orders

## 2023-01-06 NOTE — Telephone Encounter (Signed)
Copied from CRM [id number]. Topic: General - Other >> Jan 06, 2023 10:53 AM Marlow Baars wrote: Reason for CRM: The patient called back inquiring about results of her U/S of her anterior elbow. She has seen results on my chart but needs for someone to go over them with her. Please assist patient further.

## 2023-01-07 ENCOUNTER — Encounter: Payer: Self-pay | Admitting: General Surgery

## 2023-01-07 ENCOUNTER — Telehealth: Payer: Self-pay

## 2023-01-07 ENCOUNTER — Ambulatory Visit (INDEPENDENT_AMBULATORY_CARE_PROVIDER_SITE_OTHER): Payer: 59 | Admitting: General Surgery

## 2023-01-07 VITALS — BP 109/72 | HR 90 | Temp 98.0°F | Ht 59.0 in | Wt 181.0 lb

## 2023-01-07 DIAGNOSIS — M25529 Pain in unspecified elbow: Secondary | ICD-10-CM

## 2023-01-07 DIAGNOSIS — M25522 Pain in left elbow: Secondary | ICD-10-CM

## 2023-01-07 DIAGNOSIS — C499 Malignant neoplasm of connective and soft tissue, unspecified: Secondary | ICD-10-CM

## 2023-01-07 DIAGNOSIS — M7989 Other specified soft tissue disorders: Secondary | ICD-10-CM

## 2023-01-07 DIAGNOSIS — R2232 Localized swelling, mass and lump, left upper limb: Secondary | ICD-10-CM

## 2023-01-07 NOTE — Patient Instructions (Addendum)
We have scheduled you for a MRI of your Left elbow to better assess this area.   You are scheduled for an MRI at [facility] on 01/12/23. You will need to [facility] at 2:30 pm.   We will have you follow up here after we get your results.

## 2023-01-07 NOTE — Telephone Encounter (Signed)
Message left for the patient letting her know that her MRI has been authorized and is now scheduled for tomorrow at [facility] entrance. She is to arrive there at 5:45 pm. She will now follow up with Dr Maurine Minister on Tuesday November 12th at 8:45 am.

## 2023-01-08 ENCOUNTER — Ambulatory Visit
Admission: RE | Admit: 2023-01-08 | Discharge: 2023-01-08 | Disposition: A | Discharge status: Home / Self Care | Payer: 59 | Source: Ambulatory Visit | Attending: General Surgery

## 2023-01-08 DIAGNOSIS — M7989 Other specified soft tissue disorders: Secondary | ICD-10-CM | POA: Diagnosis present

## 2023-01-08 DIAGNOSIS — C499 Malignant neoplasm of connective and soft tissue, unspecified: Secondary | ICD-10-CM | POA: Diagnosis present

## 2023-01-08 DIAGNOSIS — M25529 Pain in unspecified elbow: Secondary | ICD-10-CM | POA: Diagnosis present

## 2023-01-08 MED ORDER — GADOBUTROL 1 MMOL/ML IV SOLN
7.5000 mL | Freq: Once | INTRAVENOUS | Status: AC | PRN
  Administered 2023-01-08: 7.5 mL via INTRAVENOUS

## 2023-01-11 NOTE — Progress Notes (Signed)
Patient ID: Sandra Munoz, female   DOB: September 07, 1980, 42 y.o.   MRN: [id number] CC: Left Elbow Mass History of Present Illness Sandra Munoz is a 42 y.o. female who presents in evaluation for rapidly enlarging left elbow mass.  The patient reports that she noticed a mass in her left antecubital fossa approximately a month ago.  She says that the mass has grown quite quickly and now she does have some pain with it.  She says the pain is centered on the mass and is worse when the mass is palpated.  She also reports that she has numbness in her pinky ring middle and index finger.  She denies any overlying skin changes or drainage from the mass..  Past Medical History Past Medical History:  Diagnosis Date   Allergy    Depression    Polycystic ovarian syndrome        Past Surgical History:  Procedure Laterality Date   ABDOMINAL HYSTERECTOMY     GASTRIC BYPASS     REDUCTION MAMMAPLASTY Bilateral 2002   TONSILLECTOMY AND ADENOIDECTOMY      Allergies  Allergen Reactions   Milk Protein Hives    Patient reports lactose intolerance; she is able to eat [place] yogurt without issue    Current Outpatient Medications  Medication Sig Dispense Refill   Cholecalciferol (VITAMIN D3) 250 MCG (10000 UT) capsule      clonazePAM (KLONOPIN) 0.5 MG tablet Take 1 tablet (0.5 mg total) by mouth at bedtime. 14 tablet 0   cyanocobalamin (,VITAMIN B-12,) 1000 MCG/ML injection INJECT 1 ML INTO THE SKIN INTRAMUSCULARLY ONCE EVERY MONTH 1 mL 11   DULoxetine (CYMBALTA) 60 MG capsule Take 1 capsule (60 mg total) by mouth daily. 90 capsule 1   guanFACINE (INTUNIV) 2 MG TB24 ER tablet Take 2 mg by mouth daily.     hydrOXYzine (ATARAX) 10 MG tablet Take 1 tablet (10 mg total) by mouth 3 (three) times daily as needed. 30 tablet 0   Magnesium 250 MG TABS      metFORMIN (GLUCOPHAGE-XR) 500 MG 24 hr tablet TAKE 1 TABLET BY MOUTH EVERY DAY WITH BREAKFAST 90 tablet 0   Omega-3 Fatty Acids (FISH OIL PO) 1 capsule 2 (two) times  daily. 1500mg      No current facility-administered medications for this visit.    Family History Family History  Problem Relation Age of Onset   Cancer Mother        SCC of skin/Involved throat   Autoimmune disease Mother    Breast cancer Neg Hx        Social History Social History   Tobacco Use   Smoking status: Former    Current packs/day: 0.00    Types: Cigarettes    Quit date: 09/30/2009    Years since quitting: 13.2    Passive exposure: Past   Smokeless tobacco: Never  Vaping Use   Vaping status: Never Used  Substance Use Topics   Alcohol use: Not Currently   Drug use: Never        ROS Full ROS of systems performed and is otherwise negative there than what is stated in the HPI  Physical Exam Blood pressure 109/72, pulse 90, temperature 98 F (36.7 C), height 4\' 11"  (1.499 m), weight 181 lb (82.1 kg), SpO2 99%.  No acute distress, alert and oriented x 3, PERRLA, moving all extremities spontaneously.  In the left antecubital fossa is soft tissue mass.  On palpation of the superior part of the mass is soft  and ballotable however just inferior to this there is an area that is more tender to palpation and feels harder and more adhered to the underlying structures.  There are no overlying skin changes  Data Reviewed Ultrasound reviewed and there is a circumscribed area of fat corresponding to the physical exam.  There are no concerning features on this ultrasound.  I have personally reviewed the patient's imaging and medical records.    Assessment    42 year old female with left antecubital fossa soft tissue mass that has been rapidly growing over the last month.  She does have some compressive symptoms but in a nonanatomic distribution including part of the ulnar nerve distribution and median nerve distribution.  Plan  Given that she does have neuropathic compressive symptoms and a history of rapidly enlarging mass I think it is prudent that we get an MRI to make  sure that there are no features concerning for soft tissue sarcomas.  I discussed with her that if this did involve any underlying structures such as the vasculature or the nerves then she may be better suited off having this done by a hand surgeon but if this is purely a lipoma then we could discuss resection if there are no concerning features.  We will see her back once the MRI is done    Sandra Munoz 01/11/2023, 9:45 AM

## 2023-01-12 ENCOUNTER — Ambulatory Visit: Payer: 59

## 2023-01-12 ENCOUNTER — Ambulatory Visit: Payer: Self-pay | Admitting: General Surgery

## 2023-01-14 ENCOUNTER — Encounter: Payer: Self-pay | Admitting: General Surgery

## 2023-01-14 ENCOUNTER — Ambulatory Visit: Payer: Self-pay | Admitting: General Surgery

## 2023-01-14 ENCOUNTER — Ambulatory Visit: Payer: 59 | Admitting: General Surgery

## 2023-01-14 VITALS — BP 121/79 | HR 92 | Temp 98.8°F | Ht 59.0 in | Wt 182.2 lb

## 2023-01-14 DIAGNOSIS — R2232 Localized swelling, mass and lump, left upper limb: Secondary | ICD-10-CM | POA: Diagnosis not present

## 2023-01-14 DIAGNOSIS — M25529 Pain in unspecified elbow: Secondary | ICD-10-CM

## 2023-01-14 DIAGNOSIS — M7989 Other specified soft tissue disorders: Secondary | ICD-10-CM | POA: Diagnosis not present

## 2023-01-14 NOTE — Patient Instructions (Signed)
Actinic Keratosis An actinic keratosis is a precancerous growth on the skin. If there is more than one, the condition is called actinic keratoses. These growths appear most often on parts of the skin that get a lot of sun exposure, including the: Scalp. Face. Ears. Lips. Upper back. Forearms. Backs of the hands. If left untreated, these growths may develop into a skin cancer called squamous cell carcinoma. It is important to have all these growths checked by a health care provider to determine the best treatment. What are the causes? Actinic keratoses are caused by getting too much ultraviolet (UV) radiation from the sun or other UV light sources. What increases the risk? You are more likely to develop this condition if you: Have light-colored skin or blue eyes. Have blond or red hair. Spend a lot of time in the sun. Do not protect your skin from the sun when outdoors. Are an older person. The risk of developing an actinic keratosis increases with age. What are the signs or symptoms? These growths feel like scaly, rough spots of skin. Symptoms of this condition include growths that may: Be as small as a pinhead or as big as a quarter. Itch, hurt, or feel sensitive. Be skin-colored, light tan, dark tan, pink, or a combination of these colors. In most cases, the growths become red. Have a small piece of pink or gray skin (skin tag) growing from them. It may be easier to notice the growths by feeling them rather than seeing them. Sometimes, actinic keratoses disappear but may return a few days to a few weeks later. How is this diagnosed? This condition is usually diagnosed with a physical exam. A tissue sample may be removed from the growth and examined under a microscope (biopsy). How is this treated? This condition may be treated by: Scraping off the actinic keratosis (curettage). Freezing the actinic keratosis with liquid nitrogen (cryosurgery). This causes the growth to eventually  fall off. Applying medicated creams or gels to destroy the cells in the growth. Applying chemicals to the growth to make the outer layers of skin peel off (chemical peel). Using photodynamic therapy. In this procedure, medicated cream is applied to the actinic keratosis. This cream increases your skin's sensitivity to light. Then, a strong light is aimed at the actinic keratosis to destroy cells in the growth. Follow these instructions at home: Skin care Apply cool, wet cloths (coolcompresses) to the affected areas. Do not scratch your skin. Check your skin regularly for any growths, especially ones that: Start to itch or bleed. Change in size, shape, or color. Caring for the treated area Keep the treated area clean and dry as told by your health care provider. Do not apply any medicine, cream, or lotion to the treated area unless your health care provider tells you to do that. Do not pick at blisters or try to break them open. This can cause infection and scarring. If you have red or irritated skin after treatment, follow instructions from your health care provider about how to take care of the treated area. Make sure you: Wash your hands with soap and water for at least 20 seconds before and after you change your bandage (dressing). If soap and water are not available, use hand sanitizer. Change your dressing as told by your health care provider. If you have red or irritated skin after treatment, check the treated area every day for signs of infection. Check for: Redness, swelling, or pain. Fluid or blood. Warmth. Pus or a bad   smell. Lifestyle Do not use any products that contain nicotine or tobacco. These products include cigarettes, chewing tobacco, and vaping devices, such as e-cigarettes. If you need help quitting, ask your health care provider. Take steps to protect your skin from the sun, such as: Avoiding the sun between 10:00 a.m. and 4:00 p.m. This is when the UV light is the  strongest. Using a sunscreen or sunblock with SPF 30 or greater. Applying sunscreen before you are exposed to sunlight and reapplying as often as told by the instructions on the sunscreen container. Wearing protective gear, including: Sunglasses with UV protection. A hat and clothing that protect your skin from sunlight. Avoiding medicines that increase your sensitivity to sunlight when possible. Avoidingtanning beds and other indoor tanning devices. General instructions Take or apply over-the-counter and prescription medicines only as told by your health care provider. Return to your normal activities as told by your health care provider. Ask your health care provider what activities are safe for you. Have a skin exam done every year by a health care provider who is a skin specialist (dermatologist). Keep all follow-up visits. Your health care provider will want to check that the site has healed after treatment. Contact a health care provider if: You notice any changes or new growths on your skin. You have swelling, pain, or redness around your treated area. You have fluid or blood coming from your treated area. Your treated area feels warm to the touch. You have pus or a bad smell coming from your treated area. You have a fever or chills. You have a blister that becomes large and painful. Summary An actinic keratosis is a precancerous growth on the skin.If left untreated, these growths can develop into skin cancer. Check your skin regularly for any growths, especially growths that start to itch or bleed, or change in size, shape, or color. Take steps to protect your skin from the sun. Contact a health care provider if you notice any changes or new growths on your skin. This information is not intended to replace advice given to you by your health care provider. Make sure you discuss any questions you have with your health care provider. Document Revised: 05/01/2021 Document Reviewed:  05/01/2021 Elsevier Patient Education  2024 Elsevier Inc.  

## 2023-01-14 NOTE — Progress Notes (Signed)
Outpatient Surgical Follow Up  01/14/2023  Sandra Munoz is an 42 y.o. female.   Chief Complaint  Patient presents with   Follow-up    Left arm mass    HPI: The patient returns for evaluation of left arm mass.  She reports no worsening of her symptoms as described last visit.  She did have an MRI that did not show any discrete masses or lesions.  Past Medical History:  Diagnosis Date   Allergy    Depression    Polycystic ovarian syndrome     Past Surgical History:  Procedure Laterality Date   ABDOMINAL HYSTERECTOMY     GASTRIC BYPASS     REDUCTION MAMMAPLASTY Bilateral 2002   TONSILLECTOMY AND ADENOIDECTOMY      Family History  Problem Relation Age of Onset   Cancer Mother        SCC of skin/Involved throat   Autoimmune disease Mother    Breast cancer Neg Hx     Social History:  reports that she quit smoking about 13 years ago. Her smoking use included cigarettes. She has been exposed to tobacco smoke. She has never used smokeless tobacco. She reports that she does not currently use alcohol. She reports that she does not use drugs.  Allergies:  Allergies  Allergen Reactions   Milk Protein Hives    Patient reports lactose intolerance; she is able to eat [place] yogurt without issue    Medications reviewed.    ROS Full ROS performed and is otherwise negative other than what is stated in HPI   BP 121/79 (BP Location: Right Arm, Patient Position: Sitting, Cuff Size: Small)   Pulse 92   Temp 98.8 F (37.1 C) (Oral)   Ht 4\' 11"  (1.499 m)   Wt 182 lb 3.2 oz (82.6 kg)   SpO2 97%   BMI 36.80 kg/m   Physical Exam Left arm there is prominent soft tissue with some induration and pain on palpation.  This is just medial to the left antecubital fossa  MRI and ultrasound reviewed.  On MRI there is normal subcutaneous fat without any masses, tumors or lesions.  Radiology read significant for normal subcutaneous fat Assessment/Plan:  Gust with patient that there is  no abnormalities found on MRI and this correlates to what is seen on ultrasound.  As far as the induration this could be secondary to some fat necrosis or other benign process but not something that I think would warrant surgical intervention.  Recommended that she continue to monitor this and if she is concerned about increase in size we can see her again in 6 months but for now there is no evidence of pathology.  She is agreeable to this plan and will call us if she has any questions   Baker Pierini, M.D.  [facility]

## 2023-01-31 ENCOUNTER — Encounter: Payer: Self-pay | Admitting: Internal Medicine

## 2023-02-01 ENCOUNTER — Other Ambulatory Visit: Payer: Self-pay | Admitting: Internal Medicine

## 2023-02-01 DIAGNOSIS — Z720 Tobacco use: Secondary | ICD-10-CM

## 2023-02-01 MED ORDER — VARENICLINE TARTRATE (STARTER) 0.5 MG X 11 & 1 MG X 42 PO TBPK: ORAL_TABLET | ORAL | 0 refills | Status: DC

## 2023-02-09 ENCOUNTER — Encounter: Payer: Self-pay | Admitting: Internal Medicine

## 2023-02-10 NOTE — Progress Notes (Unsigned)
   Acute Office Visit  Subjective:     Patient ID: Sandra Munoz, female    DOB: 10/10/1980, 42 y.o.   MRN: [id number]  No chief complaint on file.   HPI Patient is in today for STD screenings.   ROS      Objective:    There were no vitals taken for this visit. {Vitals History (Optional):23777}  Physical Exam  No results found for any visits on 02/11/23.      Assessment & Plan:   Problem List Items Addressed This Visit   None   No orders of the defined types were placed in this encounter.   No follow-ups on file.  Margarita Mail, DO

## 2023-02-11 ENCOUNTER — Other Ambulatory Visit (HOSPITAL_COMMUNITY)
Admission: RE | Admit: 2023-02-11 | Discharge: 2023-02-11 | Disposition: A | Discharge status: Home / Self Care | Payer: Self-pay | Source: Ambulatory Visit | Attending: Internal Medicine | Admitting: Internal Medicine

## 2023-02-11 ENCOUNTER — Ambulatory Visit (INDEPENDENT_AMBULATORY_CARE_PROVIDER_SITE_OTHER): Payer: 59 | Admitting: Internal Medicine

## 2023-02-11 ENCOUNTER — Encounter: Payer: Self-pay | Admitting: Internal Medicine

## 2023-02-11 VITALS — BP 120/76 | HR 98 | Temp 98.1°F | Ht 59.0 in | Wt 180.3 lb

## 2023-02-11 DIAGNOSIS — N760 Acute vaginitis: Secondary | ICD-10-CM

## 2023-02-11 DIAGNOSIS — Z113 Encounter for screening for infections with a predominantly sexual mode of transmission: Secondary | ICD-10-CM | POA: Insufficient documentation

## 2023-02-11 DIAGNOSIS — B9689 Other specified bacterial agents as the cause of diseases classified elsewhere: Secondary | ICD-10-CM

## 2023-02-12 ENCOUNTER — Encounter: Payer: Self-pay | Admitting: Internal Medicine

## 2023-02-12 LAB — CERVICOVAGINAL ANCILLARY ONLY
Bacterial Vaginitis (gardnerella): POSITIVE — AB
Candida Glabrata: NEGATIVE
Candida Vaginitis: NEGATIVE
Chlamydia: NEGATIVE
Comment: NEGATIVE
Comment: NEGATIVE
Comment: NEGATIVE
Comment: NEGATIVE
Comment: NEGATIVE
Comment: NORMAL
Neisseria Gonorrhea: NEGATIVE
Trichomonas: NEGATIVE

## 2023-02-12 LAB — RPR: RPR Ser Ql: NONREACTIVE

## 2023-02-12 LAB — HIV ANTIBODY (ROUTINE TESTING W REFLEX): HIV 1&2 Ab, 4th Generation: NONREACTIVE

## 2023-02-12 MED ORDER — METRONIDAZOLE 500 MG PO TABS: 500.0000 mg | ORAL_TABLET | Freq: Two times a day (BID) | ORAL | 0 refills | Status: AC

## 2023-02-12 NOTE — Addendum Note (Signed)
Addended by: Margarita Mail on: 02/12/2023 12:58 PM   Modules accepted: Orders

## 2023-03-29 ENCOUNTER — Encounter: Payer: Self-pay | Admitting: Internal Medicine

## 2023-03-29 ENCOUNTER — Other Ambulatory Visit: Payer: Self-pay

## 2023-03-29 ENCOUNTER — Ambulatory Visit: Payer: 59 | Admitting: Internal Medicine

## 2023-03-29 VITALS — BP 118/72 | HR 98 | Temp 98.0°F | Resp 16 | Ht 59.0 in | Wt 180.9 lb

## 2023-03-29 DIAGNOSIS — E538 Deficiency of other specified B group vitamins: Secondary | ICD-10-CM

## 2023-03-29 DIAGNOSIS — F17201 Nicotine dependence, unspecified, in remission: Secondary | ICD-10-CM

## 2023-03-29 MED ORDER — CYANOCOBALAMIN 1000 MCG/ML IJ SOLN: INTRAMUSCULAR | 11 refills | Status: AC

## 2023-03-29 NOTE — Progress Notes (Signed)
   Acute Office Visit  Subjective:     Patient ID: Sandra Munoz, female    DOB: [birth year], 43 y.o.   MRN: [id number]  Chief Complaint  Patient presents with   Nicotine Dependence    Follow up no smoking since 02/25/23    HPI Patient is in today for fatigue and to follow up on Chantix.   Patient has been feeling much more fatigued, however she ran out of her vitamin B12 injection and has not had this in about 1 month.  Labs reviewed from October, no anemia and MCV normal.  Regarding the Chantix, she did complete the starter pack and was successful, she has not had a cigarette since December 26th, 2024.   Review of Systems  Constitutional:  Positive for malaise/fatigue.        Objective:    BP 118/72 (Cuff Size: Large)   Pulse 98   Temp 98 F (36.7 C) (Oral)   Resp 16   Ht 4\' 11"  (1.499 m)   Wt 180 lb 14.4 oz (82.1 kg)   SpO2 98%   BMI 36.54 kg/m  BP Readings from Last 3 Encounters:  03/29/23 118/72  02/11/23 120/76  01/14/23 121/79   Wt Readings from Last 3 Encounters:  03/29/23 180 lb 14.4 oz (82.1 kg)  02/11/23 180 lb 4.8 oz (81.8 kg)  01/14/23 182 lb 3.2 oz (82.6 kg)      Physical Exam Constitutional:      Appearance: Normal appearance.  HENT:     Head: Normocephalic and atraumatic.  Eyes:     Conjunctiva/sclera: Conjunctivae normal.  Cardiovascular:     Rate and Rhythm: Normal rate and regular rhythm.  Pulmonary:     Effort: Pulmonary effort is normal.     Breath sounds: Normal breath sounds.  Skin:    General: Skin is warm and dry.  Neurological:     General: No focal deficit present.     Mental Status: She is alert.  Psychiatric:        Mood and Affect: Mood normal.        Behavior: Behavior normal.     No results found for any visits on 03/29/23.      Assessment & Plan:   1. B12 deficiency (Primary): Will refill vitamin B12 IM supplies, plan to recheck labs in 6 months or sooner if she continues to feel fatigued once restarting.  -  cyanocobalamin (VITAMIN B12) 1000 MCG/ML injection; INJECT 1 ML INTO THE SKIN INTRAMUSCULARLY ONCE EVERY MONTH  Dispense: 1 mL; Refill: 11  2. Tobacco use disorder, mild, in early remission: No longer on Chantix, last cigarette was over a month ago, efforts congratulated.   Return in about 6 months (around 09/26/2023).  Margarita Mail, DO

## 2023-07-02 ENCOUNTER — Other Ambulatory Visit: Payer: Self-pay | Admitting: Internal Medicine

## 2023-07-02 DIAGNOSIS — F419 Anxiety disorder, unspecified: Secondary | ICD-10-CM

## 2023-07-05 NOTE — Telephone Encounter (Signed)
 Requested Prescriptions  Pending Prescriptions Disp Refills   DULoxetine  (CYMBALTA ) 60 MG capsule [Pharmacy Med Name: DULOXETINE  HCL DR 60 MG CAP] 90 capsule 0    Sig: TAKE 1 CAPSULE BY MOUTH EVERY DAY     Psychiatry: Antidepressants - SNRI - duloxetine  Failed - 07/05/2023  3:24 PM      Failed - Valid encounter within last 6 months    Recent Outpatient Visits   None     Future Appointments             In 2 months Sandra Cid, DO [facility]            Passed - Cr in normal range and within 360 days    Creat  Date Value Ref Range Status  12/18/2022 0.78 0.50 - 0.99 mg/dL Final         Passed - eGFR is 30 or above and within 360 days    GFR, Est African American  Date Value Ref Range Status  09/20/2019 127 > OR = 60 mL/min/1.33m2 Final   GFR, Est Non African American  Date Value Ref Range Status  09/20/2019 110 > OR = 60 mL/min/1.92m2 Final   GFR, Estimated  Date Value Ref Range Status  01/30/2021 >60 >60 mL/min Final    Comment:    (NOTE) Calculated using the CKD-EPI Creatinine Equation (2021)    eGFR  Date Value Ref Range Status  12/18/2022 97 > OR = 60 mL/min/1.68m2 Final         Passed - Completed PHQ-2 or PHQ-9 in the last 360 days      Passed - Last BP in normal range    BP Readings from Last 1 Encounters:  03/29/23 118/72

## 2023-07-21 ENCOUNTER — Encounter: Payer: Self-pay | Admitting: Internal Medicine

## 2023-08-17 ENCOUNTER — Ambulatory Visit (INDEPENDENT_AMBULATORY_CARE_PROVIDER_SITE_OTHER): Payer: Self-pay | Admitting: Physician Assistant

## 2023-08-17 DIAGNOSIS — Z91199 Patient's noncompliance with other medical treatment and regimen due to unspecified reason: Secondary | ICD-10-CM

## 2023-08-17 NOTE — Progress Notes (Signed)
 Patient no-showed today's appointment.

## 2023-09-06 ENCOUNTER — Encounter: Payer: Self-pay | Admitting: Internal Medicine

## 2023-09-07 ENCOUNTER — Other Ambulatory Visit: Payer: Self-pay | Admitting: Internal Medicine

## 2023-09-07 DIAGNOSIS — Z8619 Personal history of other infectious and parasitic diseases: Secondary | ICD-10-CM

## 2023-09-07 MED ORDER — VALACYCLOVIR HCL 500 MG PO TABS: ORAL_TABLET | ORAL | 1 refills | Status: AC

## 2023-09-28 ENCOUNTER — Ambulatory Visit: Payer: 59 | Admitting: Internal Medicine

## 2023-09-28 ENCOUNTER — Encounter: Payer: Self-pay | Admitting: Internal Medicine

## 2023-09-28 ENCOUNTER — Other Ambulatory Visit: Payer: Self-pay

## 2023-09-28 ENCOUNTER — Ambulatory Visit: Payer: Self-pay | Admitting: Internal Medicine

## 2023-09-28 VITALS — BP 110/68 | HR 99 | Temp 98.1°F | Resp 18 | Ht 59.0 in | Wt 153.9 lb

## 2023-09-28 DIAGNOSIS — F419 Anxiety disorder, unspecified: Secondary | ICD-10-CM

## 2023-09-28 DIAGNOSIS — R7303 Prediabetes: Secondary | ICD-10-CM

## 2023-09-28 DIAGNOSIS — F909 Attention-deficit hyperactivity disorder, unspecified type: Secondary | ICD-10-CM

## 2023-09-28 DIAGNOSIS — Z1231 Encounter for screening mammogram for malignant neoplasm of breast: Secondary | ICD-10-CM

## 2023-09-28 DIAGNOSIS — E88819 Insulin resistance, unspecified: Secondary | ICD-10-CM

## 2023-09-28 LAB — POCT GLYCOSYLATED HEMOGLOBIN (HGB A1C): Hemoglobin A1C: 5.7 % — AB (ref 4.0–5.6)

## 2023-09-28 MED ORDER — METFORMIN HCL ER 500 MG PO TB24: 500.0000 mg | ORAL_TABLET | Freq: Every day | ORAL | 1 refills | Status: DC

## 2023-09-28 MED ORDER — HYDROXYZINE PAMOATE 25 MG PO CAPS: 25.0000 mg | ORAL_CAPSULE | Freq: Every evening | ORAL | 1 refills | Status: AC | PRN

## 2023-09-28 MED ORDER — DULOXETINE HCL 60 MG PO CPEP: 60.0000 mg | ORAL_CAPSULE | Freq: Every day | ORAL | 1 refills | Status: AC

## 2023-09-28 MED ORDER — GUANFACINE HCL ER 2 MG PO TB24: 2.0000 mg | ORAL_TABLET | Freq: Every day | ORAL | 1 refills | Status: AC

## 2023-09-28 NOTE — Progress Notes (Signed)
 Established Patient Office Visit  Subjective:  Patient ID: Sandra Munoz, female    DOB: [birth year]  Age: 43 y.o. MRN: [id number]  CC:  Chief Complaint  Patient presents with   Medical Management of Chronic Issues    6 month recheck    HPI [name] presents for follow up on chronic medical conditions.   Discussed the use of AI scribe software for clinical note transcription with the patient, who gave verbal consent to proceed.  History of Present Illness Sandra Munoz is a 43 year old female with type 2 diabetes and ADHD who presents for a six-month follow-up and medication refills.  She manages her type 2 diabetes with metformin  extended-release, which causes fewer gastrointestinal issues. She experienced a hypoglycemic episode when she ran out of metformin , with blood sugar dropping to 49 mg/dL. She has lost 45 pounds and is eager to know her current A1c level.  Her ADHD is stable on guanfacine  2 mg taken once daily at night. She wishes to transfer the management of her non-stimulant ADHD medication to this provider due to inconvenience with her current specialist. Stimulant medications were not well-tolerated, causing significant anxiety and physical symptoms.  She takes duloxetine  60 mg for anxiety and reports doing well with it. Hydroxyzine  25 mg was prescribed three months ago for sleep disturbances related to stress from a divorce. She takes it at 7 PM to avoid feeling 'hungover' the next day and reports it helps her sleep by 8:30 PM.  She has a family history of aggressive squamous cell cancer, with her brother recently diagnosed and undergoing extensive surgery. Her mother passed away from the same cancer at age 3, and her brother's children have a genetic autoimmune disorder that predisposes them to similar health issues.   MDD/ADHD: -Mood status: controlled  -Current treatment: Cymbalta  60 mg, hydroxyzine  25 for sleep. Also on Guanfacine  2 mg daily for ADHD, wanting  me to take over prescription  -Satisfied with current treatment?: yes  -Duration of current treatment : years -Side effects: no Medication compliance: excellent compliance Psychotherapy/counseling: no in the past Previous psychiatric medications: lexapro  and zoloft      02/11/2023    7:58 AM 01/05/2023    3:26 PM 12/18/2022    1:50 PM 09/30/2021    3:46 PM 07/09/2021    4:15 PM  Depression screen PHQ 2/9  Decreased Interest 0 0 0 0 0  Down, Depressed, Hopeless 0 0 0 0 1  PHQ - 2 Score 0 0 0 0 1  Altered sleeping  0 0 0   Tired, decreased energy  0 0 0   Change in appetite  0 0 0   Feeling bad or failure about yourself   0 0 0   Trouble concentrating  0 0 0   Moving slowly or fidgety/restless  0 0 0   Suicidal thoughts  0 0 0   PHQ-9 Score  0 0 0   Difficult doing work/chores  Not difficult at all Not difficult at all Not difficult at all    HLD: -Medications: None -Last lipid panel: Lipid Panel     Component Value Date/Time   CHOL 195 09/24/2020 0845   TRIG 106 09/24/2020 0845   HDL 52 09/24/2020 0845   CHOLHDL 3.8 09/24/2020 0845   LDLCALC 122 (H) 09/24/2020 0845   The ASCVD Risk score (Arnett DK, et al., 2019) failed to calculate for the following reasons:   Cannot find a previous HDL lab   Cannot find  a previous total cholesterol lab  Pre-Diabetes: -Last A1c 10/24 6.0% -She is now working out at a gym doing cardio and weights and working with a trainer there. She has made dietary changes and is eating lower carbs - only eating long grain brown rice and focusing more on proteins.  -We have tried prescribing GLP-1's in the past, however her insurance will not cover -Currently on Metformin  500 mg XR once daily  Health Maintenance: -Blood work UTD -Mammogram 9/23 due, ordered placed  Past Medical History:  Diagnosis Date   Allergy    Depression    Polycystic ovarian syndrome     Past Surgical History:  Procedure Laterality Date   ABDOMINAL HYSTERECTOMY      GASTRIC BYPASS     REDUCTION MAMMAPLASTY Bilateral 2002   TONSILLECTOMY AND ADENOIDECTOMY      Family History  Problem Relation Age of Onset   Cancer Mother        SCC of skin/Involved throat   Autoimmune disease Mother    Breast cancer Neg Hx     Social History   Socioeconomic History   Marital status: Married    Spouse name: Lonni   Number of children: 1   Years of education: Not on file   Highest education level: Bachelor's degree (e.g., BA, AB, BS)  Occupational History   Not on file  Tobacco Use   Smoking status: Former    Current packs/day: 0.00    Types: Cigarettes    Quit date: 09/30/2009    Years since quitting: 14.0    Passive exposure: Past   Smokeless tobacco: Never  Vaping Use   Vaping status: Never Used  Substance and Sexual Activity   Alcohol use: Not Currently   Drug use: Never   Sexual activity: Yes    Birth control/protection: Surgical  Other Topics Concern   Not on file  Social History Narrative   Moved from [place]; Accountant; 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    Social Drivers of Corporate investment banker Strain: Low Risk  (09/21/2023)   Overall Financial Resource Strain (CARDIA)    Difficulty of Paying Living Expenses: Not very hard  Food Insecurity: No Food Insecurity (09/21/2023)   Hunger Vital Sign    Worried About Running Out of Food in the Last Year: Never true    Ran Out of Food in the Last Year: Never true  Transportation Needs: No Transportation Needs (09/21/2023)   PRAPARE - Administrator, Civil Service (Medical): No    Lack of Transportation (Non-Medical): No  Physical Activity: Inactive (09/21/2023)   Exercise Vital Sign    Days of Exercise per Week: 0 days    Minutes of Exercise per Session: Not on file  Stress: No Stress Concern Present (09/21/2023)   [employer] of Occupational Health - Occupational Stress Questionnaire    Feeling of Stress: Only a little  Social Connections:  Moderately Isolated (09/21/2023)   Social Connection and Isolation Panel    Frequency of Communication with Friends and Family: More than three times a week    Frequency of Social Gatherings with Friends and Family: Once a week    Attends Religious Services: Never    Database administrator or Organizations: No    Attends Banker Meetings: Not on file    Marital Status: Living with partner  Intimate Partner Violence: Not At Risk (09/30/2021)   Humiliation, Afraid, Rape, and Kick questionnaire  Fear of Current or Ex-Partner: No    Emotionally Abused: No    Physically Abused: No    Sexually Abused: No    Outpatient Medications Prior to Visit  Medication Sig Dispense Refill   Cholecalciferol (VITAMIN D3) 250 MCG (10000 UT) capsule      clonazePAM  (KLONOPIN ) 0.5 MG tablet Take 1 tablet (0.5 mg total) by mouth at bedtime. 14 tablet 0   cyanocobalamin  (VITAMIN B12) 1000 MCG/ML injection INJECT 1 ML INTO THE SKIN INTRAMUSCULARLY ONCE EVERY MONTH 1 mL 11   DULoxetine  (CYMBALTA ) 60 MG capsule TAKE 1 CAPSULE BY MOUTH EVERY DAY 90 capsule 0   guanFACINE  (INTUNIV ) 2 MG TB24 ER tablet Take 2 mg by mouth daily.     hydrOXYzine  (ATARAX ) 10 MG tablet Take 1 tablet (10 mg total) by mouth 3 (three) times daily as needed. 30 tablet 0   Magnesium 250 MG TABS      metFORMIN  (GLUCOPHAGE -XR) 500 MG 24 hr tablet TAKE 1 TABLET BY MOUTH EVERY DAY WITH BREAKFAST 90 tablet 0   Omega-3 Fatty Acids (FISH OIL PO) 1 capsule 2 (two) times daily. 1500mg      valACYclovir  (VALTREX ) 500 MG tablet Take 1000 mg on first day of outbreak, followed by 500 mg daily for 3 days. 30 tablet 1   No facility-administered medications prior to visit.    Allergies  Allergen Reactions   Milk Protein Hives    Patient reports lactose intolerance; she is able to eat [place] yogurt without issue    ROS Review of Systems  All other systems reviewed and are negative.     Objective:    Physical Exam Constitutional:       Appearance: Normal appearance.  HENT:     Head: Normocephalic and atraumatic.  Eyes:     Conjunctiva/sclera: Conjunctivae normal.  Cardiovascular:     Rate and Rhythm: Normal rate and regular rhythm.  Pulmonary:     Effort: Pulmonary effort is normal.     Breath sounds: Normal breath sounds.  Skin:    General: Skin is warm and dry.  Neurological:     General: No focal deficit present.     Mental Status: She is alert. Mental status is at baseline.  Psychiatric:        Mood and Affect: Mood is depressed.        Behavior: Behavior normal.     BP 110/68 (Cuff Size: Large)   Pulse 99   Temp 98.1 F (36.7 C) (Oral)   Resp 18   Ht 4' 11 (1.499 m)   Wt 153 lb 14.4 oz (69.8 kg)   SpO2 99%   BMI 31.08 kg/m  Wt Readings from Last 3 Encounters:  09/28/23 153 lb 14.4 oz (69.8 kg)  03/29/23 180 lb 14.4 oz (82.1 kg)  02/11/23 180 lb 4.8 oz (81.8 kg)     Health Maintenance Due  Topic Date Due   Hepatitis B Vaccines (1 of 3 - 19+ 3-dose series) Never done   HPV VACCINES (1 - Risk 3-dose SCDM series) Never done   MAMMOGRAM  11/28/2022       Topic Date Due   Hepatitis B Vaccines (1 of 3 - 19+ 3-dose series) Never done   HPV VACCINES (1 - Risk 3-dose SCDM series) Never done    Lab Results  Component Value Date   TSH 1.28 08/04/2022   Lab Results  Component Value Date   WBC 11.2 (H) 12/18/2022   HGB 12.8 12/18/2022  HCT 39.6 12/18/2022   MCV 85.3 12/18/2022   PLT 384 12/18/2022   Lab Results  Component Value Date   NA 138 12/18/2022   K 4.3 12/18/2022   CO2 25 12/18/2022   GLUCOSE 95 12/18/2022   BUN 10 12/18/2022   CREATININE 0.78 12/18/2022   BILITOT 0.4 12/18/2022   ALKPHOS 35 (L) 08/02/2020   AST 13 12/18/2022   ALT 14 12/18/2022   PROT 6.9 12/18/2022   ALBUMIN 3.7 08/02/2020   CALCIUM 9.0 12/18/2022   ANIONGAP 5 01/30/2021   EGFR 97 12/18/2022   Lab Results  Component Value Date   CHOL 195 09/24/2020   Lab Results  Component Value Date   HDL  52 09/24/2020   Lab Results  Component Value Date   LDLCALC 122 (H) 09/24/2020   Lab Results  Component Value Date   TRIG 106 09/24/2020   Lab Results  Component Value Date   CHOLHDL 3.8 09/24/2020   Lab Results  Component Value Date   HGBA1C 6.0 (H) 12/18/2022      Assessment & Plan:   Assessment & Plan Pre-Diabetes Managed with metformin  extended release. No gastrointestinal side effects. No hypoglycemia except when metformin  missed. - Refill metformin  extended release 500 mg twice daily. - Perform A1c finger stick test today which was good at 5.7%. - Schedule full labs at next visit in six months.  Attention-deficit hyperactivity disorder (ADHD) Managed with guanfacine  2 mg daily. No stimulant medications due to past adverse effects. She prefers management transfer to current provider. - Prescribe guanfacine  2 mg once daily. - Transfer ADHD management to current provider.  Anxiety Managed with duloxetine  60 mg daily. Reports doing well. Hydroxyzine  25 mg at bedtime used for sleep disturbances related to situational stressors. - Refill duloxetine  60 mg daily. - Prescribe hydroxyzine  25 mg at bedtime as needed for sleep.  Insomnia Related to situational stressors, managed with hydroxyzine  25 mg at bedtime. - Continue hydroxyzine  25 mg at bedtime as needed for sleep.  Lipoma of left upper arm Swelling with upper respiratory symptoms or overuse.  General Health Maintenance Up to date on vaccinations. Mammogram overdue since September 2023. Colon cancer screening to be discussed next year at age 7. - Order mammogram. - Discuss colon cancer screening next year.  Follow-up Follow-up in six months for routine evaluation and labs. - Schedule follow-up appointment in six months. - Order labs one week prior to next appointment.  - DULoxetine  (CYMBALTA ) 60 MG capsule; Take 1 capsule (60 mg total) by mouth daily.  Dispense: 90 capsule; Refill: 1 - hydrOXYzine   (VISTARIL ) 25 MG capsule; Take 1 capsule (25 mg total) by mouth at bedtime as needed (insomnia).  Dispense: 90 capsule; Refill: 1 - guanFACINE  (INTUNIV ) 2 MG TB24 ER tablet; Take 1 tablet (2 mg total) by mouth daily.  Dispense: 90 tablet; Refill: 1 - metFORMIN  (GLUCOPHAGE -XR) 500 MG 24 hr tablet; Take 1 tablet (500 mg total) by mouth daily with breakfast.  Dispense: 90 tablet; Refill: 1 - POCT HgB A1C - MM 3D SCREENING MAMMOGRAM BILATERAL BREAST; Future   Follow-up: Return in about 6 months (around 03/30/2024).    Sharyle Fischer, DO

## 2023-10-26 ENCOUNTER — Encounter: Payer: Self-pay | Admitting: Internal Medicine

## 2023-10-26 ENCOUNTER — Ambulatory Visit: Admitting: Internal Medicine

## 2023-10-26 ENCOUNTER — Other Ambulatory Visit: Payer: Self-pay

## 2023-10-26 VITALS — BP 120/72 | HR 96 | Temp 97.9°F | Resp 16 | Ht 59.0 in | Wt 157.7 lb

## 2023-10-26 DIAGNOSIS — R350 Frequency of micturition: Secondary | ICD-10-CM | POA: Diagnosis not present

## 2023-10-26 DIAGNOSIS — N3090 Cystitis, unspecified without hematuria: Secondary | ICD-10-CM

## 2023-10-26 LAB — POCT URINALYSIS DIPSTICK
Bilirubin, UA: NEGATIVE
Glucose, UA: NEGATIVE
Ketones, UA: NEGATIVE
Protein, UA: POSITIVE — AB
Spec Grav, UA: 1.02 (ref 1.010–1.025)
Urobilinogen, UA: 0.2 U/dL
pH, UA: 5 (ref 5.0–8.0)

## 2023-10-26 MED ORDER — SULFAMETHOXAZOLE-TRIMETHOPRIM 800-160 MG PO TABS: 1.0000 | ORAL_TABLET | Freq: Two times a day (BID) | ORAL | 0 refills | Status: AC

## 2023-10-26 NOTE — Progress Notes (Signed)
 Acute Office Visit  Subjective:     Patient ID: Sandra Munoz, female    DOB: 11/02/1980, 43 y.o.   MRN: [id number]  Chief Complaint  Patient presents with   Urinary Frequency    Urinary Frequency  Associated symptoms include frequency. Pertinent negatives include no chills, flank pain, hematuria or urgency.   Patient is in today for urinary symptoms.   Discussed the use of AI scribe software for clinical note transcription with the patient, who gave verbal consent to proceed.  History of Present Illness Sandra Munoz is a 43 year old female who presents with symptoms of a urinary tract infection.  Symptoms began on Sunday afternoon with a 'twinge' that worsened to significant discomfort by Monday morning. She experiences nocturia, waking every two hours to urinate, and dysuria described as a burning sensation. She has tenderness and an achy feeling in her lower back, without any sensation of movement upwards. She is planning to travel to [place] for a wedding in [place]  on Saturday and is concerned about managing her symptoms during the trip. She has no antibiotic allergies and has not taken Azo before.   Review of Systems  Constitutional:  Negative for chills and fever.  Gastrointestinal:  Positive for abdominal pain.  Genitourinary:  Positive for dysuria and frequency. Negative for flank pain, hematuria and urgency.        Objective:    BP 120/72 (Cuff Size: Large)   Pulse 96   Temp 97.9 F (36.6 C) (Oral)   Resp 16   Ht 4' 11 (1.499 m)   Wt 157 lb 11.2 oz (71.5 kg)   SpO2 98%   BMI 31.85 kg/m    Physical Exam Constitutional:      Appearance: Normal appearance.  HENT:     Head: Normocephalic and atraumatic.  Eyes:     Conjunctiva/sclera: Conjunctivae normal.  Cardiovascular:     Rate and Rhythm: Normal rate and regular rhythm.  Pulmonary:     Effort: Pulmonary effort is normal.     Breath sounds: Normal breath sounds.  Abdominal:     Tenderness:  There is no right CVA tenderness or left CVA tenderness.  Skin:    General: Skin is warm and dry.  Neurological:     General: No focal deficit present.     Mental Status: She is alert. Mental status is at baseline.  Psychiatric:        Mood and Affect: Mood normal.        Behavior: Behavior normal.     Results for orders placed or performed in visit on 10/26/23  POCT urinalysis dipstick  Result Value Ref Range   Color, UA yellow    Clarity, UA clear    Glucose, UA Negative Negative   Bilirubin, UA negative    Ketones, UA negative    Spec Grav, UA 1.020 1.010 - 1.025   Blood, UA trace    pH, UA 5.0 5.0 - 8.0   Protein, UA Positive (A) Negative   Urobilinogen, UA 0.2 0.2 or 1.0 E.U./dL   Nitrite, UA small    Leukocytes, UA Small (1+) (A) Negative   Appearance clear    Odor none         Assessment & Plan:   Assessment & Plan Urinary tract infection Acute UTI with dysuria and frequency. Urinalysis positive for nitrates and leukocytes. No antibiotic allergies. Bactrim  chosen for efficacy. - Prescribed Bactrim  (sulfamethoxazole  and trimethoprim ) twice daily for three days. Advised two doses  today, continue for two more days. - Sent urine sample for culture to confirm bacterial sensitivity. - Recommended Azo for symptomatic relief, noted potential urine discoloration. - Instructed to maintain adequate hydration. - Consider alternative antibiotics if culture indicates resistance. - Sent prescription to [facility] for immediate pickup. - Discussed potential to send alternative antibiotics to [place]  if needed.  - POCT urinalysis dipstick - Urine Culture - sulfamethoxazole -trimethoprim  (BACTRIM  DS) 800-160 MG tablet; Take 1 tablet by mouth 2 (two) times daily for 3 days.  Dispense: 6 tablet; Refill: 0   Return if symptoms worsen or fail to improve.  Sharyle Fischer, DO

## 2023-10-28 LAB — URINE CULTURE
MICRO NUMBER:: 16886165
SPECIMEN QUALITY:: ADEQUATE

## 2023-10-29 ENCOUNTER — Ambulatory Visit: Payer: Self-pay | Admitting: Internal Medicine

## 2023-11-03 ENCOUNTER — Ambulatory Visit
Admission: RE | Admit: 2023-11-03 | Discharge: 2023-11-03 | Disposition: A | Discharge status: Home / Self Care | Source: Ambulatory Visit | Attending: Internal Medicine | Admitting: Internal Medicine

## 2023-11-03 DIAGNOSIS — Z1231 Encounter for screening mammogram for malignant neoplasm of breast: Secondary | ICD-10-CM | POA: Insufficient documentation

## 2023-11-07 IMAGING — RF DG UGI W SINGLE CM
14 of 21 series · 14 of 21 positions shown · non-contrast
Comparison: None.

CLINICAL DATA: Epigastric pain for 2 months. Roux-en-Y gastric
bypass surgery approximately 6 years ago.

EXAM:
UPPER GI SERIES WITH KUB
TECHNIQUE: After obtaining a scout radiograph a routine upper GI series was
performed using thin barium.
FLUOROSCOPY TIME:  Fluoroscopy Time:  2 minutes 18 seconds
Radiation Exposure Index (if provided by the fluoroscopic device):
36.7 mGy
Number of Acquired Spot Images: 0

[Series 1: w abdomen upright · 1 of 1 slices shown]
[im 1/1]
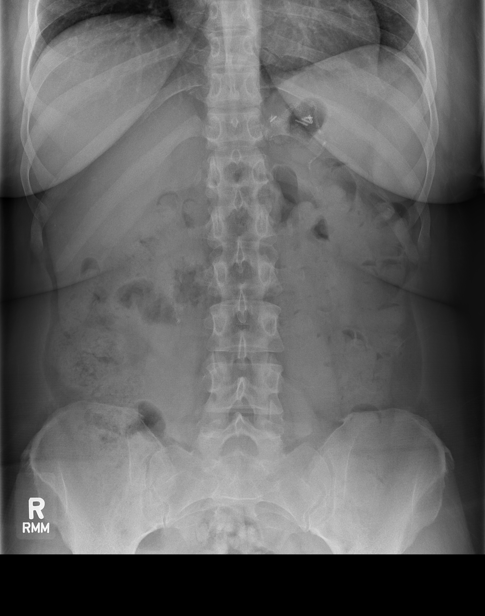

[Series 3: cp_standard · 1 of 1 slices shown (1 of 13)]
[im 1/1]
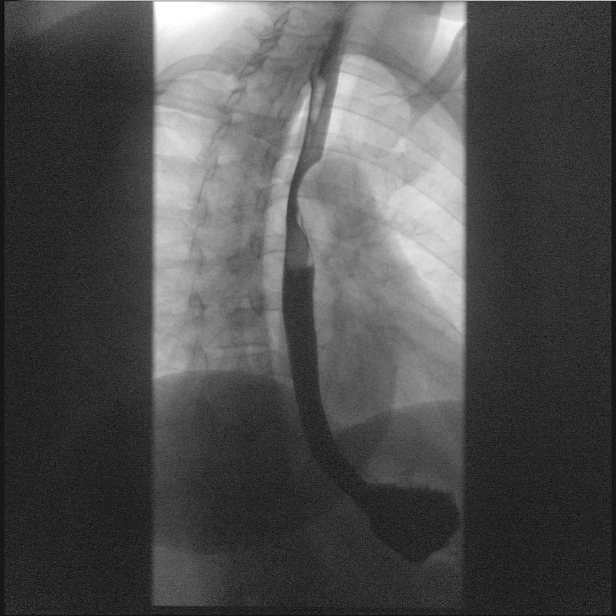

[Series 4: cp_standard · 1 of 1 slices shown (2 of 13)]
[im 1/1]
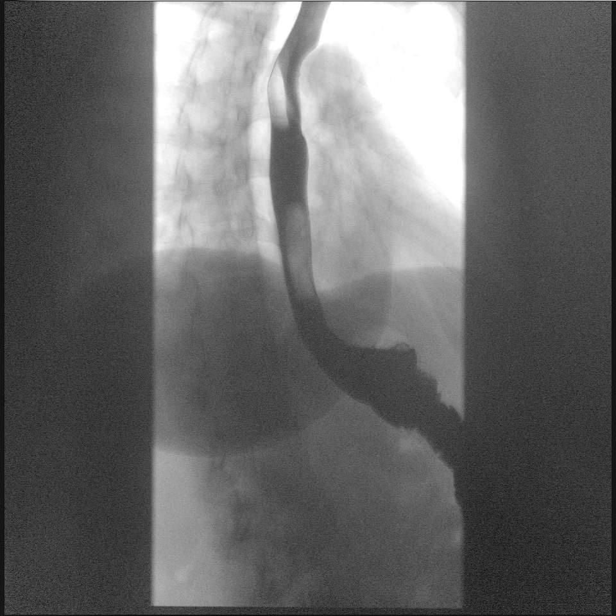

[Series 6: cp_standard · 1 of 1 slices shown (3 of 13)]
[im 1/1]
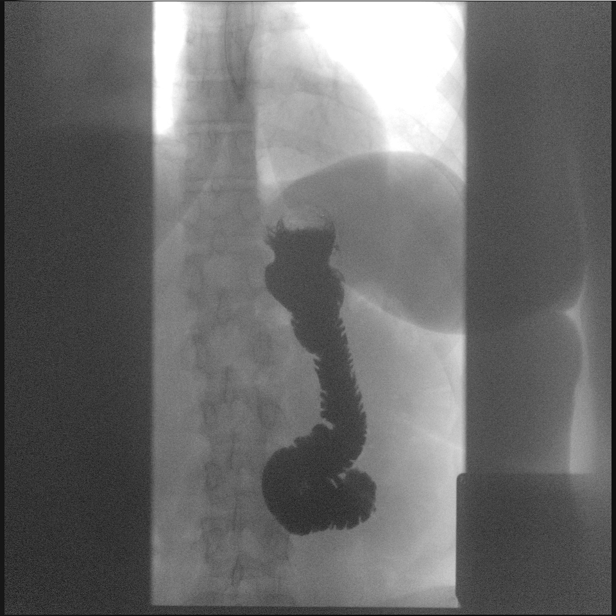

[Series 7: cp_standard · 1 of 1 slices shown (4 of 13)]
[im 1/1]
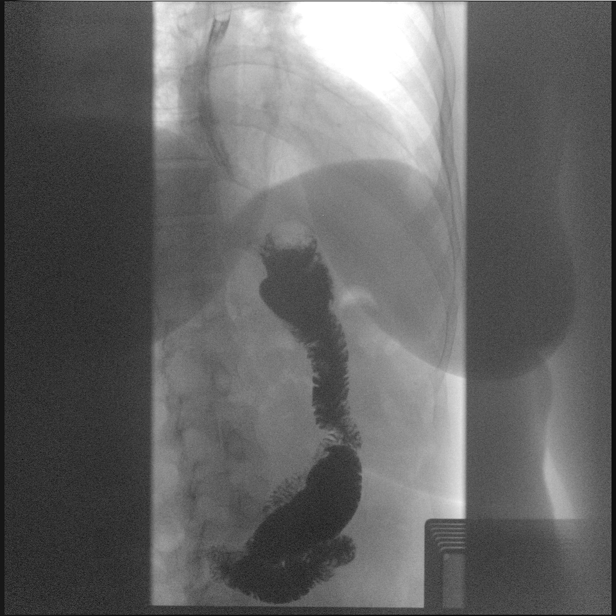

[Series 9: cp_standard · 1 of 1 slices shown (5 of 13)]
[im 1/1]
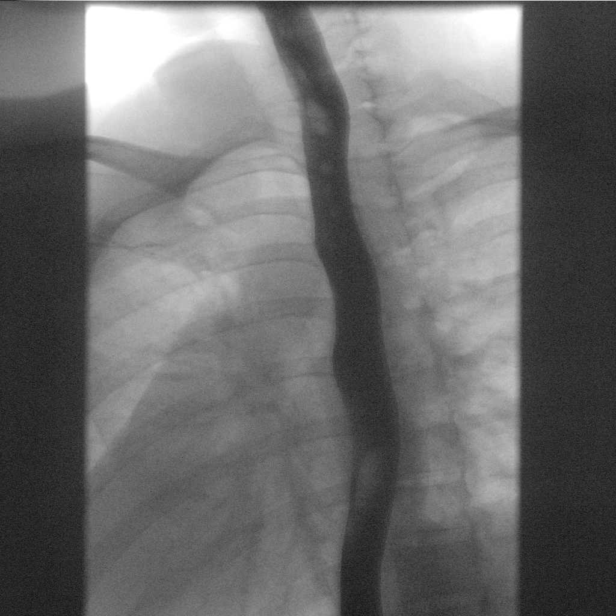

[Series 10: cp_standard · 1 of 1 slices shown (6 of 13)]
[im 1/1]
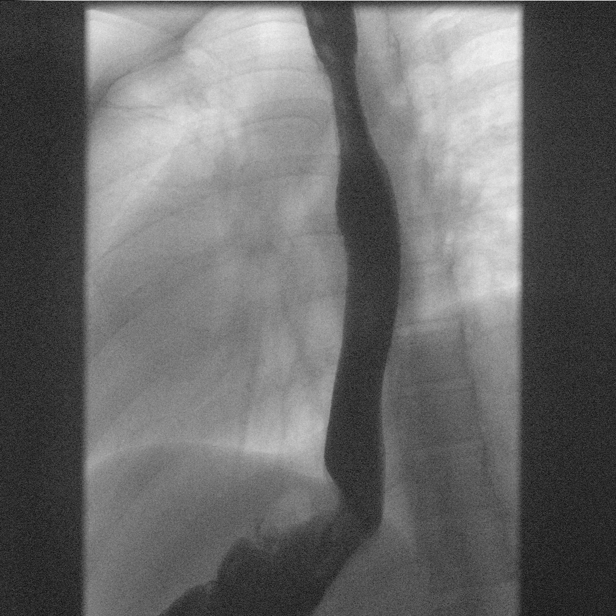

[Series 12: cp_standard · 1 of 1 slices shown (7 of 13)]
[im 1/1]
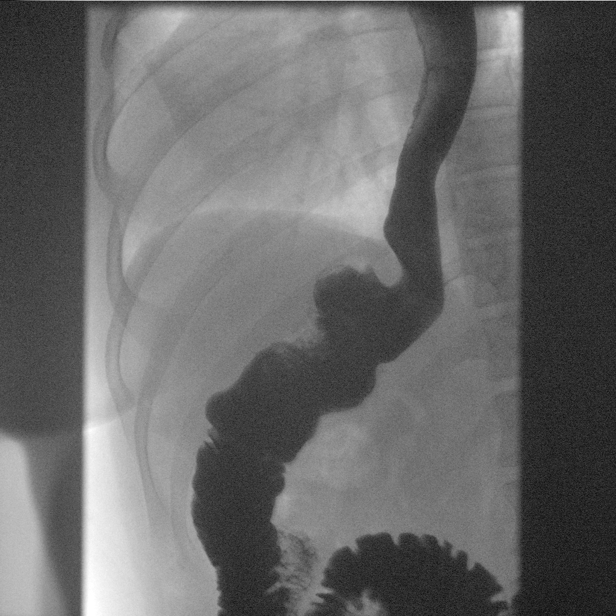

[Series 13: cp_standard · 1 of 1 slices shown (8 of 13)]
[im 1/1]
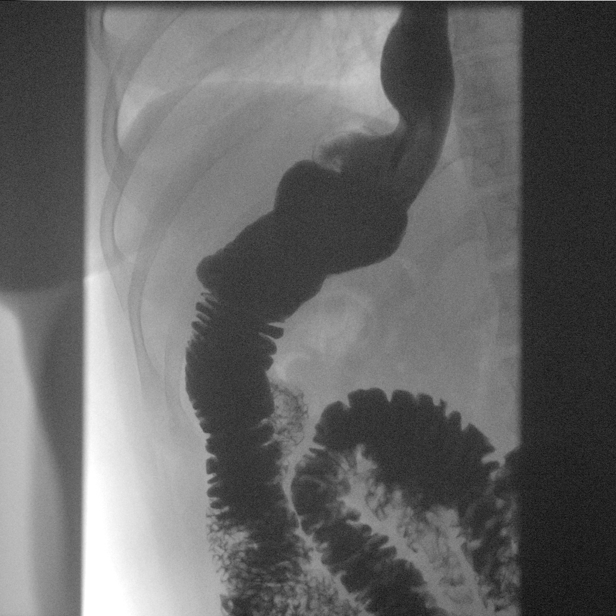

[Series 15: cp_standard · 1 of 1 slices shown (9 of 13)]
[im 1/1]
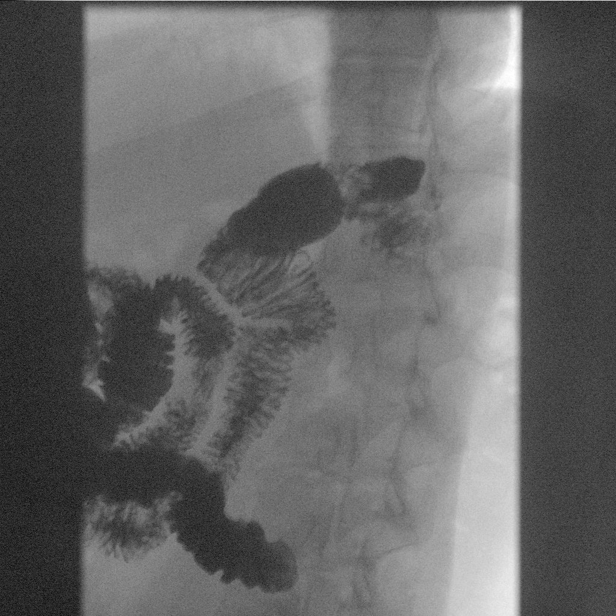

[Series 16: cp_standard · 1 of 1 slices shown (10 of 13)]
[im 1/1]
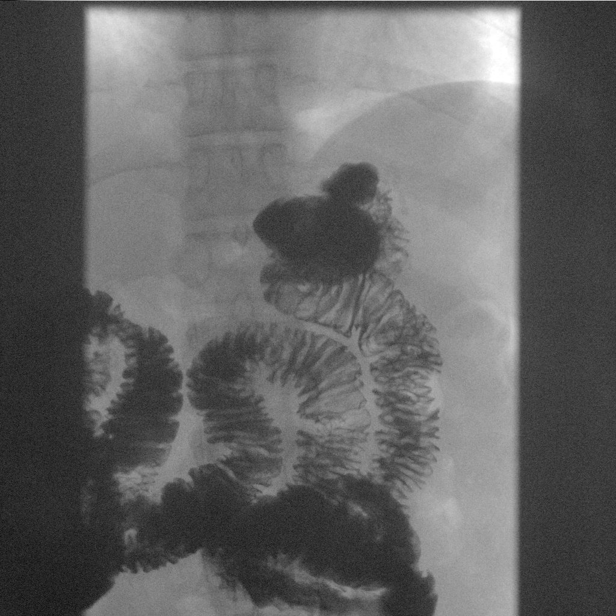

[Series 18: cp_standard · 1 of 1 slices shown (11 of 13)]
[im 1/1]
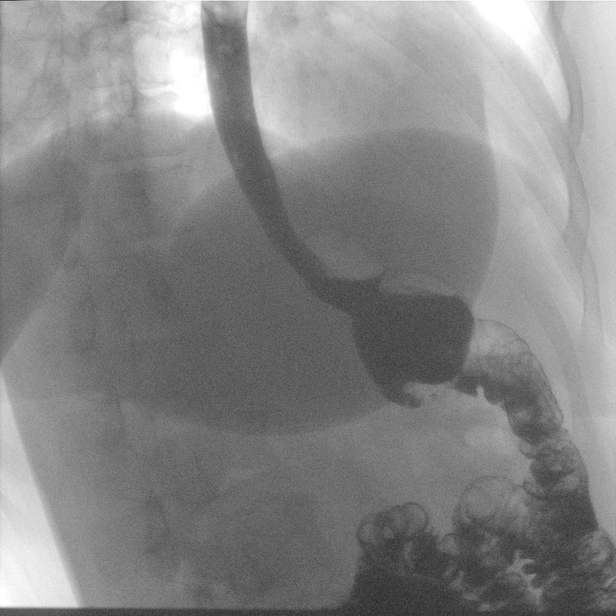

[Series 19: cp_standard · 1 of 1 slices shown (12 of 13)]
[im 1/1]
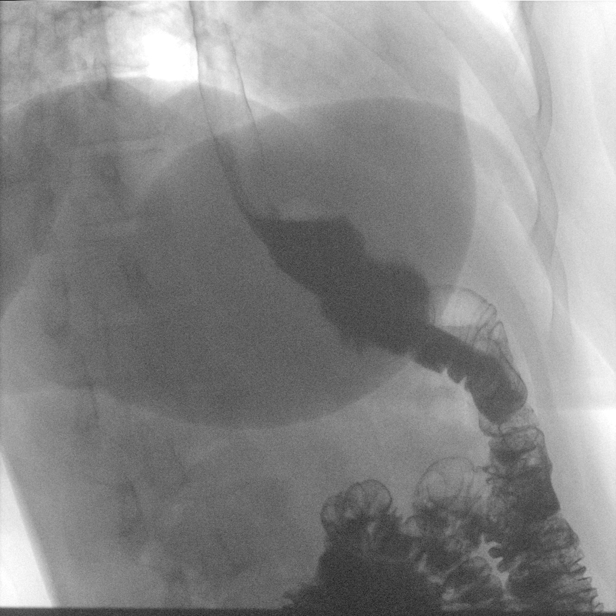

[Series 22: cp_standard · 1 of 1 slices shown (13 of 13)]
[im 1/1]
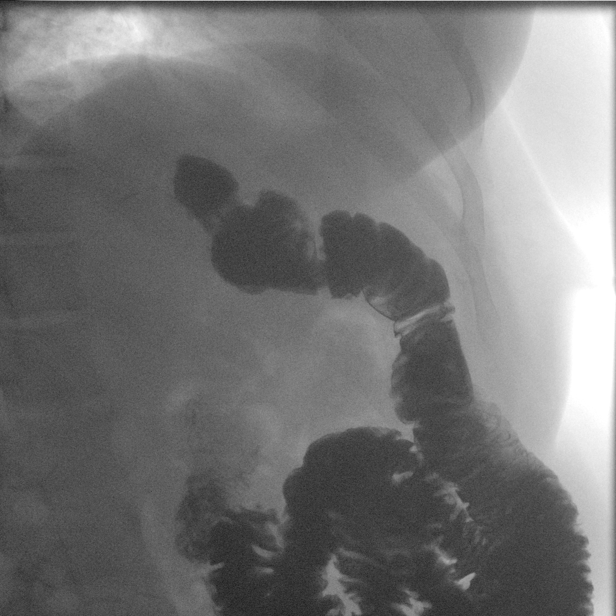

[14 of 21 positions shown; findings below may reference images not displayed]

FINDINGS: Scout radiograph: Unremarkable bowel gas pattern. Surgical clips and
staples seen from previous gastric bypass surgery.

Esophagus: No evidence of esophageal mass or stricture. Mild
esophageal dysmotility is noted, with break up of primary
peristalsis in the midthoracic esophagus. No gastroesophageal reflux
observed. Ingested 13mm barium tablet passed through the esophagus
and into the stomach, without becoming stuck.

Stomach: Expected postop changes from Roux-en-Y gastric bypass. A
small gastric pouch is seen, which is normal in size and appearance.
There is no evidence of hiatal hernia. Prompt emptying of the
gastric pouch is seen.

Small bowel: Prompt contrast passage into the efferent small bowel
is seen. No ulcer or stricture seen at the gastrojejunal
anastomosis. No evidence of dilatation or wall thickening involving
the jejunum.

Other:  None.
IMPRESSION: Normal postop appearance of Roux-en-Y gastric bypass, with normal
size gastric pouch. No evidence of hiatal hernia or stricture.

Mild esophageal dysmotility.

## 2024-01-07 ENCOUNTER — Ambulatory Visit (INDEPENDENT_AMBULATORY_CARE_PROVIDER_SITE_OTHER): Admitting: Internal Medicine

## 2024-01-07 ENCOUNTER — Encounter: Payer: Self-pay | Admitting: Internal Medicine

## 2024-01-07 ENCOUNTER — Other Ambulatory Visit: Payer: Self-pay

## 2024-01-07 VITALS — BP 120/72 | HR 95 | Temp 97.8°F | Resp 16 | Ht 59.0 in | Wt 160.0 lb

## 2024-01-07 DIAGNOSIS — Z77120 Contact with and (suspected) exposure to mold (toxic): Secondary | ICD-10-CM

## 2024-01-07 DIAGNOSIS — Z23 Encounter for immunization: Secondary | ICD-10-CM | POA: Diagnosis not present

## 2024-01-07 DIAGNOSIS — E162 Hypoglycemia, unspecified: Secondary | ICD-10-CM | POA: Diagnosis not present

## 2024-01-07 DIAGNOSIS — Z13220 Encounter for screening for lipoid disorders: Secondary | ICD-10-CM | POA: Diagnosis not present

## 2024-01-07 DIAGNOSIS — E88819 Insulin resistance, unspecified: Secondary | ICD-10-CM | POA: Diagnosis not present

## 2024-01-07 DIAGNOSIS — E538 Deficiency of other specified B group vitamins: Secondary | ICD-10-CM

## 2024-01-07 LAB — POCT GLYCOSYLATED HEMOGLOBIN (HGB A1C): Hemoglobin A1C: 5.5 % (ref 4.0–5.6)

## 2024-01-07 NOTE — Progress Notes (Signed)
 Established Patient Office Visit  Subjective:  Patient ID: Sandra Munoz, female    DOB: 28-Dec-1980  Age: 43 y.o. MRN: [id number]  CC:  Chief Complaint  Patient presents with   Hypoglycemia    BS dropping to 40's   mold exposure    HPI Sandra Munoz presents to discuss episodes of low blood sugar and mold exposure.   Discussed the use of AI scribe software for clinical note transcription with the patient, who gave verbal consent to proceed.  History of Present Illness  Sandra Munoz is a 43 year old female with PCOS who presents with episodes of hypoglycemia.  Over the past three weeks, she experiences episodes of hypoglycemia with blood sugar levels dropping to as low as 40 mg/dL. These episodes occur randomly throughout the day, including while driving, and are accompanied by heart palpitations, sweating, trembling legs, severe headaches, and fatigue. Blood sugar levels are typically around 110 to 120 mg/dL an hour after eating. She is currently taking metformin  500 mg and suspects it may contribute to her low blood sugar levels. Despite managing her diet by avoiding high carbohydrate and liquid sugar intake due to previous gastrointestinal bypass surgery, hypoglycemic episodes persist. Her recent A1c level is 5.5%, lower than previous levels, possibly due to these episodes. She uses finger stick blood sugar monitors at work and in her cars.  She has been exposed to black mold in her home, leading to respiratory symptoms such as sinus congestion, 'goopy' eyes, and persistent sinus drainage, as well as skin rashes that have left scars. She manages these symptoms by washing her belongings and using a dehumidifier. She noticed improvement in brain fog when away from her mold-exposed home and is in the process of moving to a new house to avoid further exposure.   MDD/ADHD: -Mood status: controlled  -Current treatment: Cymbalta  60 mg, hydroxyzine  25 for sleep. Also on Guanfacine  2 mg  daily for ADHD, wanting me to take over prescription  -Satisfied with current treatment?: yes  -Duration of current treatment : years -Side effects: no Medication compliance: excellent compliance Psychotherapy/counseling: no in the past Previous psychiatric medications: lexapro  and zoloft      02/11/2023    7:58 AM 01/05/2023    3:26 PM 12/18/2022    1:50 PM 09/30/2021    3:46 PM 07/09/2021    4:15 PM  Depression screen PHQ 2/9  Decreased Interest 0 0 0 0 0  Down, Depressed, Hopeless 0 0 0 0 1  PHQ - 2 Score 0 0 0 0 1  Altered sleeping  0 0 0   Tired, decreased energy  0 0 0   Change in appetite  0 0 0   Feeling bad or failure about yourself   0 0 0   Trouble concentrating  0 0 0   Moving slowly or fidgety/restless  0 0 0   Suicidal thoughts  0 0 0   PHQ-9 Score  0  0  0    Difficult doing work/chores  Not difficult at all Not difficult at all Not difficult at all      Data saved with a previous flowsheet row definition   HLD: -Medications: None -Last lipid panel: Lipid Panel     Component Value Date/Time   CHOL 195 09/24/2020 0845   TRIG 106 09/24/2020 0845   HDL 52 09/24/2020 0845   CHOLHDL 3.8 09/24/2020 0845   LDLCALC 122 (H) 09/24/2020 0845   The ASCVD Risk score (Arnett DK, et al., 2019)  failed to calculate for the following reasons:   Cannot find a previous HDL lab   Cannot find a previous total cholesterol lab  Pre-Diabetes: -Last A1c 7/25 5.7% -She is now working out at a gym doing cardio and weights and working with a trainer there. She has made dietary changes and is eating lower carbs - only eating long grain brown rice and focusing more on proteins.  -We have tried prescribing GLP-1's in the past, however her insurance will not cover -Currently on Metformin  500 mg XR once daily but now frequent episodes of low blood sugars, sometimes going down in the 40's with hypoglycemic symptoms  Health Maintenance: -Blood work UTD -Mammogram 9/25 Birads-1 -Flu  vaccine today  Past Medical History:  Diagnosis Date   Allergy    Depression    Polycystic ovarian syndrome     Past Surgical History:  Procedure Laterality Date   ABDOMINAL HYSTERECTOMY     GASTRIC BYPASS     REDUCTION MAMMAPLASTY Bilateral 2002   TONSILLECTOMY AND ADENOIDECTOMY      Family History  Problem Relation Age of Onset   Cancer Mother        SCC of skin/Involved throat   Autoimmune disease Mother    Breast cancer Neg Hx     Social History   Socioeconomic History   Marital status: Married    Spouse name: Lonni   Number of children: 1   Years of education: Not on file   Highest education level: Bachelor's degree (e.g., BA, AB, BS)  Occupational History   Not on file  Tobacco Use   Smoking status: Former    Current packs/day: 0.00    Types: Cigarettes    Quit date: 09/30/2009    Years since quitting: 14.2    Passive exposure: Past   Smokeless tobacco: Never  Vaping Use   Vaping status: Never Used  Substance and Sexual Activity   Alcohol use: Not Currently   Drug use: Never   Sexual activity: Yes    Birth control/protection: Surgical  Other Topics Concern   Not on file  Social History Narrative   Moved from [place]; Accountant; 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    Social Drivers of Corporate Investment Banker Strain: Low Risk  (01/03/2024)   Overall Financial Resource Strain (CARDIA)    Difficulty of Paying Living Expenses: Not hard at all  Food Insecurity: No Food Insecurity (01/03/2024)   Hunger Vital Sign    Worried About Running Out of Food in the Last Year: Never true    Ran Out of Food in the Last Year: Never true  Transportation Needs: No Transportation Needs (01/03/2024)   PRAPARE - Administrator, Civil Service (Medical): No    Lack of Transportation (Non-Medical): No  Physical Activity: Sufficiently Active (01/03/2024)   Exercise Vital Sign    Days of Exercise per Week: 3 days    Minutes of  Exercise per Session: 60 min  Stress: Stress Concern Present (01/03/2024)   [employer] of Occupational Health - Occupational Stress Questionnaire    Feeling of Stress: Rather much  Social Connections: Socially Isolated (01/03/2024)   Social Connection and Isolation Panel    Frequency of Communication with Friends and Family: More than three times a week    Frequency of Social Gatherings with Friends and Family: Once a week    Attends Religious Services: Never    Database Administrator or Organizations: No  Attends Banker Meetings: Not on file    Marital Status: Separated  Intimate Partner Violence: Not At Risk (09/30/2021)   Humiliation, Afraid, Rape, and Kick questionnaire    Fear of Current or Ex-Partner: No    Emotionally Abused: No    Physically Abused: No    Sexually Abused: No    Outpatient Medications Prior to Visit  Medication Sig Dispense Refill   Cholecalciferol (VITAMIN D3) 250 MCG (10000 UT) capsule      clonazePAM  (KLONOPIN ) 0.5 MG tablet Take 1 tablet (0.5 mg total) by mouth at bedtime. 14 tablet 0   cyanocobalamin  (VITAMIN B12) 1000 MCG/ML injection INJECT 1 ML INTO THE SKIN INTRAMUSCULARLY ONCE EVERY MONTH 1 mL 11   DULoxetine  (CYMBALTA ) 60 MG capsule Take 1 capsule (60 mg total) by mouth daily. 90 capsule 1   guanFACINE  (INTUNIV ) 2 MG TB24 ER tablet Take 1 tablet (2 mg total) by mouth daily. 90 tablet 1   hydrOXYzine  (VISTARIL ) 25 MG capsule Take 1 capsule (25 mg total) by mouth at bedtime as needed (insomnia). 90 capsule 1   Magnesium 250 MG TABS      metFORMIN  (GLUCOPHAGE -XR) 500 MG 24 hr tablet Take 1 tablet (500 mg total) by mouth daily with breakfast. 90 tablet 1   Omega-3 Fatty Acids (FISH OIL PO) 1 capsule 2 (two) times daily. 1500mg      valACYclovir  (VALTREX ) 500 MG tablet Take 1000 mg on first day of outbreak, followed by 500 mg daily for 3 days. 30 tablet 1   No facility-administered medications prior to visit.    Allergies  Allergen  Reactions   Milk Protein Hives    Patient reports lactose intolerance; she is able to eat Greek yogurt without issue    ROS Review of Systems  HENT:  Positive for rhinorrhea and sinus pressure.   All other systems reviewed and are negative.     Objective:    Physical Exam Constitutional:      Appearance: Normal appearance.  HENT:     Head: Normocephalic and atraumatic.     Right Ear: Ear canal and external ear normal.     Left Ear: Tympanic membrane, ear canal and external ear normal.     Ears:     Comments: Fluid behind right TM    Nose: Nose normal.     Mouth/Throat:     Mouth: Mucous membranes are moist.     Pharynx: Posterior oropharyngeal erythema present.  Eyes:     Conjunctiva/sclera: Conjunctivae normal.  Cardiovascular:     Rate and Rhythm: Normal rate and regular rhythm.  Pulmonary:     Effort: Pulmonary effort is normal.     Breath sounds: Normal breath sounds.  Skin:    General: Skin is warm and dry.  Neurological:     General: No focal deficit present.     Mental Status: She is alert. Mental status is at baseline.  Psychiatric:        Mood and Affect: Mood is depressed.        Behavior: Behavior normal.     BP 120/72 (Cuff Size: Normal)   Pulse 95   Temp 97.8 F (36.6 C) (Oral)   Resp 16   Ht 4' 11 (1.499 m)   Wt 160 lb (72.6 kg)   SpO2 99%   BMI 32.32 kg/m  Wt Readings from Last 3 Encounters:  01/07/24 160 lb (72.6 kg)  10/26/23 157 lb 11.2 oz (71.5 kg)  09/28/23 153 lb 14.4  oz (69.8 kg)     Health Maintenance Due  Topic Date Due   Hepatitis B Vaccines 19-59 Average Risk (1 of 3 - 19+ 3-dose series) Never done   HPV VACCINES (1 - Risk 3-dose SCDM series) Never done   Influenza Vaccine  10/01/2023   COVID-19 Vaccine (5 - 2025-26 season) 11/01/2023       Topic Date Due   Hepatitis B Vaccines 19-59 Average Risk (1 of 3 - 19+ 3-dose series) Never done   HPV VACCINES (1 - Risk 3-dose SCDM series) Never done    Lab Results   Component Value Date   TSH 1.28 08/04/2022   Lab Results  Component Value Date   WBC 11.2 (H) 12/18/2022   HGB 12.8 12/18/2022   HCT 39.6 12/18/2022   MCV 85.3 12/18/2022   PLT 384 12/18/2022   Lab Results  Component Value Date   NA 138 12/18/2022   K 4.3 12/18/2022   CO2 25 12/18/2022   GLUCOSE 95 12/18/2022   BUN 10 12/18/2022   CREATININE 0.78 12/18/2022   BILITOT 0.4 12/18/2022   ALKPHOS 35 (L) 08/02/2020   AST 13 12/18/2022   ALT 14 12/18/2022   PROT 6.9 12/18/2022   ALBUMIN 3.7 08/02/2020   CALCIUM 9.0 12/18/2022   ANIONGAP 5 01/30/2021   EGFR 97 12/18/2022   Lab Results  Component Value Date   CHOL 195 09/24/2020   Lab Results  Component Value Date   HDL 52 09/24/2020   Lab Results  Component Value Date   LDLCALC 122 (H) 09/24/2020   Lab Results  Component Value Date   TRIG 106 09/24/2020   Lab Results  Component Value Date   CHOLHDL 3.8 09/24/2020   Lab Results  Component Value Date   HGBA1C 5.7 (A) 09/28/2023      Assessment & Plan:   Assessment & Plan  Hypoglycemia in the setting of metformin  therapy and insulin  resistance with polycystic ovary syndrome (PCOS) Intermittent hypoglycemia with glucose levels at 40 mg/dL. Symptoms include palpitations, diaphoresis, and tremors. Metformin  likely contributing. A1c at 5.5% suggests good control but may be affected by hypoglycemia. - Discontinued metformin . - Ordered fasting labs: insulin  and C-peptide levels. - Refer to endocrinology if labs are abnormal or hypoglycemia persists.  Allergic contact dermatitis and allergic rhinitis due to mold exposure Exposure to black mold causing rash, sinus congestion, and nasal discharge. - Start Zyrtec once daily for 2-4 weeks. - Use nasal saline spray for congestion. - Consider Nasogel for dryness. - Avoid mold exposure by relocating.  Vitamin B deficiency monitoring Previously managed with B12 injections, last given 45 days ago.   - Flu vaccine  trivalent PF, 6mos and older(Flulaval,Afluria,Fluarix,Fluzone) - POCT HgB A1C - CBC w/Diff/Platelet - Comprehensive Metabolic Panel (CMET) - Insulin  and C-Peptide - Lipid Profile - Vitamin B12   Follow-up: Return for already scheduled.    Sharyle Fischer, DO

## 2024-01-11 ENCOUNTER — Ambulatory Visit: Admitting: Internal Medicine

## 2024-02-07 ENCOUNTER — Inpatient Hospital Stay: Admission: RE | Admit: 2024-02-07 | Discharge: 2024-02-07

## 2024-02-07 VITALS — BP 151/83 | HR 80 | Temp 98.1°F | Resp 18 | Wt 159.2 lb

## 2024-02-07 DIAGNOSIS — J0140 Acute pansinusitis, unspecified: Secondary | ICD-10-CM

## 2024-02-07 DIAGNOSIS — E282 Polycystic ovarian syndrome: Secondary | ICD-10-CM | POA: Insufficient documentation

## 2024-02-07 DIAGNOSIS — J069 Acute upper respiratory infection, unspecified: Secondary | ICD-10-CM

## 2024-02-07 DIAGNOSIS — L502 Urticaria due to cold and heat: Secondary | ICD-10-CM | POA: Diagnosis not present

## 2024-02-07 MED ORDER — BENZONATATE 100 MG PO CAPS: 200.0000 mg | ORAL_CAPSULE | Freq: Three times a day (TID) | ORAL | 0 refills | Status: AC

## 2024-02-07 MED ORDER — AMOXICILLIN-POT CLAVULANATE 875-125 MG PO TABS: 1.0000 | ORAL_TABLET | Freq: Two times a day (BID) | ORAL | 0 refills | Status: AC

## 2024-02-07 MED ORDER — IPRATROPIUM BROMIDE 0.06 % NA SOLN: 2.0000 | Freq: Four times a day (QID) | NASAL | 12 refills | Status: AC

## 2024-02-07 MED ORDER — PROMETHAZINE-DM 6.25-15 MG/5ML PO SYRP: 5.0000 mL | ORAL_SOLUTION | Freq: Four times a day (QID) | ORAL | 0 refills | Status: AC | PRN

## 2024-02-07 NOTE — ED Provider Notes (Signed)
 [facility]    CSN: [id number] Arrival date & time: 02/07/24  0800      History   Chief Complaint Chief Complaint  Patient presents with   Cough   Urticaria    HPI Sandra Munoz is a 43 y.o. female.   HPI  43 year old female with past medical history significant for PCOS, depression, idiopathic urticaria, anxiety, and OCD presents for evaluation of respiratory symptoms that began a week ago.  She reports that she feels a start to move into her chest and her upper chest feels very tight.  Her cough is nonproductive.  She does endorse fever, sore throat, facial pain as additional symptoms.  She denies any ear pain, shortness of breath, wheezing.  No known sick contacts though she has been in and out of [facility] as her mother-in-law is in the ICU.  Past Medical History:  Diagnosis Date   Allergy    Depression    Polycystic ovarian syndrome     Patient Active Problem List   Diagnosis Date Noted   Polycystic ovary syndrome 02/07/2024   Insulin  resistance 09/30/2021   Weight gain 06/25/2021   Vitamin D  deficiency 09/20/2019   Hyperlipidemia 09/20/2019   Current mild episode of major depressive disorder 09/20/2019   Insomnia due to other mental disorder 09/20/2019   S/P gastric bypass 09/20/2019   B12 deficiency 09/20/2019   Iron  deficiency anemia following bariatric surgery 09/20/2019   History of OCD (obsessive compulsive disorder) 08/30/2018   Anxiety disorder 08/30/2018   Hx of cold sores 08/30/2018   Urticaria of unknown origin 02/13/2013    Past Surgical History:  Procedure Laterality Date   ABDOMINAL HYSTERECTOMY     GASTRIC BYPASS     REDUCTION MAMMAPLASTY Bilateral 2002   TONSILLECTOMY AND ADENOIDECTOMY      OB History   No obstetric history on file.      Home Medications    Prior to Admission medications   Medication Sig Start Date End Date Taking? Authorizing Provider  amoxicillin -clavulanate (AUGMENTIN ) 875-125 MG tablet Take 1  tablet by mouth every 12 (twelve) hours for 7 days. 02/07/24 02/14/24 Yes Bernardino Ditch, NP  benzonatate  (TESSALON ) 100 MG capsule Take 2 capsules (200 mg total) by mouth every 8 (eight) hours. 02/07/24  Yes Bernardino Ditch, NP  ipratropium (ATROVENT ) 0.06 % nasal spray Place 2 sprays into both nostrils 4 (four) times daily. 02/07/24  Yes Bernardino Ditch, NP  metFORMIN  (GLUCOPHAGE -XR) 500 MG 24 hr tablet Take 500 mg by mouth every morning. 02/01/24  Yes [provider]  promethazine -dextromethorphan (PROMETHAZINE -DM) 6.25-15 MG/5ML syrup Take 5 mLs by mouth 4 (four) times daily as needed. 02/07/24  Yes Bernardino Ditch, NP  Cholecalciferol (VITAMIN D3) 250 MCG (10000 UT) capsule  06/09/21   [provider]  clonazePAM  (KLONOPIN ) 0.5 MG tablet Take 1 tablet (0.5 mg total) by mouth at bedtime. 10/05/22   Bernardo Fend, DO  cyanocobalamin  (VITAMIN B12) 1000 MCG/ML injection INJECT 1 ML INTO THE SKIN INTRAMUSCULARLY ONCE EVERY MONTH 03/29/23   Bernardo Fend, DO  DULoxetine  (CYMBALTA ) 60 MG capsule Take 1 capsule (60 mg total) by mouth daily. 09/28/23   Bernardo Fend, DO  guanFACINE  (INTUNIV ) 2 MG TB24 ER tablet Take 1 tablet (2 mg total) by mouth daily. 09/28/23   Bernardo Fend, DO  hydrOXYzine  (VISTARIL ) 25 MG capsule Take 1 capsule (25 mg total) by mouth at bedtime as needed (insomnia). 09/28/23   Bernardo Fend, DO  Magnesium 250 MG TABS  06/09/21  [provider]  Omega-3 Fatty Acids (FISH OIL PO) 1 capsule 2 (two) times daily. 1500mg  06/09/21   [provider]  valACYclovir  (VALTREX ) 500 MG tablet Take 1000 mg on first day of outbreak, followed by 500 mg daily for 3 days. 09/07/23   Bernardo Fend, DO    Family History Family History  Problem Relation Age of Onset   Cancer Mother        SCC of skin/Involved throat   Autoimmune disease Mother    Breast cancer Neg Hx     Social History Social History   Tobacco Use   Smoking status: Former    Current  packs/day: 0.00    Types: Cigarettes    Quit date: 09/30/2009    Years since quitting: 14.3    Passive exposure: Past   Smokeless tobacco: Never  Vaping Use   Vaping status: Never Used  Substance Use Topics   Alcohol use: Not Currently   Drug use: Never     Allergies   Milk protein   Review of Systems Review of Systems  Constitutional:  Positive for fever.  HENT:  Positive for congestion, postnasal drip, rhinorrhea, sinus pressure and sore throat. Negative for ear pain.   Respiratory:  Positive for cough. Negative for shortness of breath and wheezing.      Physical Exam Triage Vital Signs ED Triage Vitals  Encounter Vitals Group     BP      Girls Systolic BP Percentile      Girls Diastolic BP Percentile      Boys Systolic BP Percentile      Boys Diastolic BP Percentile      Pulse      Resp      Temp      Temp src      SpO2      Weight      Height      Head Circumference      Peak Flow      Pain Score      Pain Loc      Pain Education      Exclude from Growth Chart    No data found.  Updated Vital Signs BP (!) 151/83 (BP Location: Left Arm)   Pulse 80   Temp 98.1 F (36.7 C) (Oral)   Resp 18   Wt 159 lb 3.2 oz (72.2 kg)   SpO2 98%   BMI 32.15 kg/m   Visual Acuity Right Eye Distance:   Left Eye Distance:   Bilateral Distance:    Right Eye Near:   Left Eye Near:    Bilateral Near:     Physical Exam Vitals and nursing note reviewed.  Constitutional:      Appearance: Normal appearance. She is ill-appearing.  HENT:     Head: Normocephalic and atraumatic.     Right Ear: Tympanic membrane, ear canal and external ear normal. There is no impacted cerumen.     Left Ear: Tympanic membrane, ear canal and external ear normal. There is no impacted cerumen.     Nose: Congestion and rhinorrhea present.     Comments: Nasal mucosa is edematous and erythematous with clear discharge in both nares.  Bilateral frontal and maxillary sinuses are markedly tender to  compression.    Mouth/Throat:     Mouth: Mucous membranes are moist.     Pharynx: Oropharynx is clear. Posterior oropharyngeal erythema present. No oropharyngeal exudate.     Comments: Erythema to the posterior oropharynx with  clear postnasal drip.  Tonsils are surgically absent. Cardiovascular:     Rate and Rhythm: Normal rate and regular rhythm.     Pulses: Normal pulses.     Heart sounds: Normal heart sounds. No murmur heard.    No friction rub. No gallop.  Pulmonary:     Effort: Pulmonary effort is normal.     Breath sounds: Normal breath sounds. No wheezing, rhonchi or rales.  Musculoskeletal:     Cervical back: Normal range of motion and neck supple. No tenderness.  Lymphadenopathy:     Cervical: No cervical adenopathy.  Skin:    General: Skin is warm and dry.     Capillary Refill: Capillary refill takes less than 2 seconds.     Findings: No erythema or rash.  Neurological:     General: No focal deficit present.     Mental Status: She is alert and oriented to person, place, and time.      UC Treatments / Results  Labs (all labs ordered are listed, but only abnormal results are displayed) Labs Reviewed - No data to display  EKG   Radiology No results found.  Procedures Procedures (including critical care time)  Medications Ordered in UC Medications - No data to display  Initial Impression / Assessment and Plan / UC Course  I have reviewed the triage vital signs and the nursing notes.  Pertinent labs & imaging results that were available during my care of the patient were reviewed by me and considered in my medical decision making (see chart for details).   Patient is a pleasant, though mildly ill-appearing, 43 year old female presenting for evaluation of 1 week worth of respiratory symptoms as outlined in HPI above.  In the exam room, she is not in any acute distress but talking does trigger a coughing spell.  She also has been experiencing hives but reports  that she gets these when she is sick and she has a history of idiopathic urticaria.  She is not experiencing any respiratory distress.  Her lungs are clear to auscultation all fields.  Given that she has been experiencing symptoms for a week I do feel a trial of antibiotics is warranted.  I will discharge her home on Augmentin  875 twice daily with food for 7 days for treatment of pansinusitis along with Atrovent  nasal spray for her congestion.  I will prescribe Tessalon  Perles and Promethazine  DM cough syrup for cough and congestion.  Return precautions reviewed.  She denies need for work note.   Final Clinical Impressions(s) / UC Diagnoses   Final diagnoses:  URI with cough and congestion  Acute non-recurrent pansinusitis  Idiopathic urticaria due to cold     Discharge Instructions      The Augmentin  twice daily with food for 7 days for treatment of your sinusitis.  Perform sinus irrigation 2-3 times a day with a NeilMed sinus rinse kit and distilled water.  Do not use tap water.  You can use plain over-the-counter Mucinex every 6 hours to break up the stickiness of the mucus so your body can clear it.  Increase your oral fluid intake to thin out your mucus so that is also able for your body to clear more easily.  Take an over-the-counter probiotic, such as Culturelle-align-activia, 1 hour after each dose of antibiotic to prevent diarrhea.  Use over-the-counter Claritin, Zyrtec, Allegra, or Benadryl  to treat your hives.  Use the Atrovent  nasal spray, 2 squirts in each nostril every 6 hours, as needed for runny  nose and postnasal drip.  Use the Tessalon  Perles every 8 hours during the day.  Take them with a small sip of water.  They may give you some numbness to the base of your tongue or a metallic taste in your mouth, this is normal.  Use the Promethazine  DM cough syrup at bedtime for cough and congestion.  It will make you drowsy so do not take it during the day.  Return for  reevaluation or see your primary care provider for any new or worsening symptoms.      ED Prescriptions     Medication Sig Dispense Auth. Provider   amoxicillin -clavulanate (AUGMENTIN ) 875-125 MG tablet Take 1 tablet by mouth every 12 (twelve) hours for 7 days. 14 tablet Bernardino Ditch, NP   benzonatate  (TESSALON ) 100 MG capsule Take 2 capsules (200 mg total) by mouth every 8 (eight) hours. 21 capsule Bernardino Ditch, NP   ipratropium (ATROVENT ) 0.06 % nasal spray Place 2 sprays into both nostrils 4 (four) times daily. 15 mL Bernardino Ditch, NP   promethazine -dextromethorphan (PROMETHAZINE -DM) 6.25-15 MG/5ML syrup Take 5 mLs by mouth 4 (four) times daily as needed. 118 mL Bernardino Ditch, NP      PDMP not reviewed this encounter.   Bernardino Ditch, NP 02/07/24 [phone]

## 2024-02-07 NOTE — ED Triage Notes (Signed)
 Pt states started as nasal drip and feels like moving into chest x1 wk- Entered by patient. Has tried OTC meds w/o relief.  Also c/o hives all over body x1 day. States gets hives when sick. Denies any closing of throat.

## 2024-02-07 NOTE — Discharge Instructions (Addendum)
 The Augmentin  twice daily with food for 7 days for treatment of your sinusitis.  Perform sinus irrigation 2-3 times a day with a NeilMed sinus rinse kit and distilled water.  Do not use tap water.  You can use plain over-the-counter Mucinex every 6 hours to break up the stickiness of the mucus so your body can clear it.  Increase your oral fluid intake to thin out your mucus so that is also able for your body to clear more easily.  Take an over-the-counter probiotic, such as Culturelle-align-activia, 1 hour after each dose of antibiotic to prevent diarrhea.  Use over-the-counter Claritin, Zyrtec, Allegra, or Benadryl  to treat your hives.  Use the Atrovent  nasal spray, 2 squirts in each nostril every 6 hours, as needed for runny nose and postnasal drip.  Use the Tessalon  Perles every 8 hours during the day.  Take them with a small sip of water.  They may give you some numbness to the base of your tongue or a metallic taste in your mouth, this is normal.  Use the Promethazine  DM cough syrup at bedtime for cough and congestion.  It will make you drowsy so do not take it during the day.  Return for reevaluation or see your primary care provider for any new or worsening symptoms.

## 2024-03-31 ENCOUNTER — Ambulatory Visit: Admitting: Internal Medicine

## 2024-04-20 ENCOUNTER — Ambulatory Visit: Admitting: Internal Medicine
